# Patient Record
Sex: Female | Born: 1995 | Race: White | Hispanic: No | Marital: Single | Smoking: Current every day smoker
Health system: Southern US, Community
[De-identification: ages and names within clinical notes are randomized; demographics above are authoritative.]

## PROBLEM LIST (undated history)

## (undated) DIAGNOSIS — D649 Anemia, unspecified: Secondary | ICD-10-CM

## (undated) DIAGNOSIS — F419 Anxiety disorder, unspecified: Secondary | ICD-10-CM

## (undated) DIAGNOSIS — F329 Major depressive disorder, single episode, unspecified: Secondary | ICD-10-CM

## (undated) DIAGNOSIS — F32A Depression, unspecified: Secondary | ICD-10-CM

## (undated) DIAGNOSIS — B1920 Unspecified viral hepatitis C without hepatic coma: Secondary | ICD-10-CM

## (undated) HISTORY — DX: Depression, unspecified: F32.A

## (undated) HISTORY — DX: Anemia, unspecified: D64.9

## (undated) HISTORY — PX: OTHER SURGICAL HISTORY: SHX169

## (undated) HISTORY — PX: LAPAROSCOPIC CHOLECYSTECTOMY: SUR755

## (undated) HISTORY — DX: Major depressive disorder, single episode, unspecified: F32.9

## (undated) HISTORY — DX: Anxiety disorder, unspecified: F41.9

---
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
## 2016-08-21 NOTE — L&D Delivery Note (Signed)
Patient is a 21 y.o. now [id number] s/p NSVD at [redacted]w[redacted]d, who was admitted for SROM and onset of labor.  Delivery Note At 10:38 AM a viable female was delivered via Vaginal, Spontaneous Delivery (Presentation:OA).  APGAR: 9, 9; weight  pending.   Placenta status: intact.  Cord: 3-vessel with no complications.  Cord pH: pending  Anesthesia:  none Episiotomy: None Lacerations: Hemastatic periurethral bilateral tears. No repair indicated Suture Repair: none Est. Blood Loss (mL):  Mom to postpartum.  Baby to Couplet care / Skin to Skin.  Arlyce Harman 05/24/2017, 10:55 AM     Head delivered OA. No nuchal cord present. Shoulder and body delivered in usual fashion. Infant with spontaneous cry, placed on mother's abdomen, dried and bulb suctioned. Cord clamped x 2 after 1-minute delay, and cut by family member. Cord blood drawn. Placenta delivered spontaneously with gentle cord traction. Fundus firm with massage and Pitocin. Perineum inspected and found to have bilateral periuretheral lacerations, which was found to be hemostatic.  Jules Schick, DO PGY-1, [facility] 05/24/17, 10:55 AM

## 2017-03-27 ENCOUNTER — Other Ambulatory Visit (HOSPITAL_COMMUNITY)
Admission: RE | Admit: 2017-03-27 | Discharge: 2017-03-27 | Disposition: A | Discharge status: Home / Self Care | Payer: Medicaid Other | Source: Ambulatory Visit | Attending: Obstetrics and Gynecology | Admitting: Obstetrics and Gynecology

## 2017-03-27 ENCOUNTER — Encounter: Payer: Self-pay | Admitting: Obstetrics and Gynecology

## 2017-03-27 ENCOUNTER — Ambulatory Visit (INDEPENDENT_AMBULATORY_CARE_PROVIDER_SITE_OTHER): Payer: Medicaid Other | Admitting: Obstetrics and Gynecology

## 2017-03-27 DIAGNOSIS — O0930 Supervision of pregnancy with insufficient antenatal care, unspecified trimester: Secondary | ICD-10-CM | POA: Insufficient documentation

## 2017-03-27 DIAGNOSIS — Z3492 Encounter for supervision of normal pregnancy, unspecified, second trimester: Secondary | ICD-10-CM | POA: Insufficient documentation

## 2017-03-27 DIAGNOSIS — O0932 Supervision of pregnancy with insufficient antenatal care, second trimester: Secondary | ICD-10-CM | POA: Diagnosis not present

## 2017-03-27 HISTORY — DX: Supervision of pregnancy with insufficient antenatal care, unspecified trimester: O09.30

## 2017-03-27 HISTORY — DX: Encounter for supervision of normal pregnancy, unspecified, second trimester: Z34.92

## 2017-03-27 NOTE — Progress Notes (Signed)
New OB Note  03/27/2017   [facility]inic: [facility]  Chief Complaint: NOB  Transfer of Care Patient: no  History of Present Illness: Ms. Shackleford is a 21 y.o. G2P1001 @ 23/5 weeks ([facility] 11/29 [tentative], based on Patient's last menstrual period was 10/12/2016 (exact date).).  Preg complicated by has Encounter for supervision of normal pregnancy in second trimester and Late prenatal care affecting pregnancy on her problem list.   Any events prior to today's visit: no Her periods were: regular, qmonth She was using no method when she conceived.  She has Negative signs or symptoms of nausea/vomiting of pregnancy. She has Negative signs or symptoms of miscarriage or preterm labor On any different medications around the time she conceived/early pregnancy: No   ROS: A 12-point review of systems was performed and negative, except as stated in the above HPI.  OBGYN History: As per HPI. OB History  Gravida Para Term Preterm AB Living  2 1 1     1   SAB TAB Ectopic Multiple Live Births               # Outcome Date GA Lbr Len/2nd Weight Sex Delivery Anes PTL Lv  2 Current           1 Term             Obstetric Comments  G1: 08/2014, SVD 6lbs 5oz, no issues or problems.    Any issues with any prior pregnancies: no Prior children are healthy, doing well, and without any problems or issues: yes History of pap smears: Yes. Last pap smear unsure and results were negative   Past Medical History: Past Medical History:  Diagnosis Date  . Anemia   . Anxiety   . Depression     Past Surgical History: Past Surgical History:  Procedure Laterality Date  . bone graft Right    arm  . LAPAROSCOPIC CHOLECYSTECTOMY      Family History:  Family History  Problem Relation Age of Onset  . Depression Mother   . Miscarriages / [place] Mother   . Vision loss Mother   . Varicose Veins Mother   . Drug abuse Father   . Cancer Maternal Aunt   . Arthritis Maternal Grandmother   .  Cancer Maternal Grandmother   . Hypertension Maternal Grandmother   . Cancer Maternal Grandfather    She denies any history of mental retardation, birth defects or genetic disorders in her or the FOB's history  Social History:  Social History   Social History  . Marital status: Single    Spouse name: N/A  . Number of children: N/A  . Years of education: N/A   Occupational History  . Not on file.   Social History Main Topics  . Smoking status: Current Every Day Smoker    Types: Cigarettes  . Smokeless tobacco: Current User  . Alcohol use No  . Drug use: No  . Sexual activity: Yes   Other Topics Concern  . Not on file   Social History Narrative  . No narrative on file    Allergy: Allergies  Allergen Reactions  . Sulphadimidine [Sulfamethazine] Tinitus    Health Maintenance:  Mammogram Up to Date: not applicable  Current Outpatient Medications: PNV  Physical Exam:   BP 98/71   Pulse 96   Temp 97.8 F (36.6 C)   Ht 5' (1.524 m)   Wt 117 lb 9.6 oz (53.3 kg)   LMP 10/12/2016 (Exact Date)  BMI 22.97 kg/m  Body mass index is 22.97 kg/m. Contractions: Not present Vag. Bleeding: None. Fundal height: 24 FHTs: 130s  General appearance: Well nourished, well developed female in no acute distress.  Cardiovascular: S1, S2 normal, no murmur, rub or gallop, regular rate and rhythm Respiratory:  Clear to auscultation bilateral. Normal respiratory effort Abdomen: positive bowel sounds and no masses, hernias; diffusely non tender to palpation, non distended Breasts: pt denies any breast s/s. Neuro/Psych:  Normal mood and affect.  Skin:  Warm and dry.  Lymphatic:  No inguinal lymphadenopathy.   Pelvic exam: is not limited by body habitus EGBUS: within normal limits, Vagina: within normal limits and with no blood in the vault, Cervix: normal appearing cervix without discharge or lesions, visually closed Uterus:  enlarged, c/w 24 week size, and Adnexa:  normal adnexa  and no mass, fullness, tenderness  Laboratory: none  Imaging:  none  Assessment: pt doing well  Plan: 1. Encounter for supervision of normal pregnancy in second trimester, unspecified gravidity Pt states she had an u/s at [facility] [place] at around 9wks [facility] ED[facility]. Will see if [facility] has records and have pt sign release form. 2hr GTT nv. Tobacco cessation encouraged.  - Obstetric Panel, Including HIV - Hemoglobinopathy evaluation - Culture, OB Urine - Cytology - PAP - Varicella zoster antibody, IgG - Cystic fibrosis gene test - SMN1 Copy Number Analysis - ToxASSURE Select 13 (MW), Urine - US MFM OB COMP + 14 WK; Future  2. Late prenatal care affecting pregnancy in second trimester - ToxASSURE Select 2613 (MW), Urine  Problem list reviewed and updated.  Follow up in 3 weeks.  The nature of [facility] with multiple MDs and other Advanced Practice Providers was explained to patient; also emphasized that residents, students are part of our team.  >50% of 20 min visit spent on counseling and coordination of care.     Cornelia Copaharlie Analyse Angst, Jr. MD Attending [facility](Faculty Practice)

## 2017-03-27 NOTE — Progress Notes (Signed)
Late to prenatal Care @23  weeks

## 2017-03-28 LAB — CYTOLOGY - PAP
Chlamydia: NEGATIVE
Diagnosis: NEGATIVE
Neisseria Gonorrhea: NEGATIVE
Trichomonas: NEGATIVE

## 2017-03-29 LAB — HEMOGLOBINOPATHY EVALUATION
HGB C: 0 %
HGB S: 0 %
HGB VARIANT: 0 %
Hemoglobin A2 Quantitation: 2.6 % (ref 1.8–3.2)
Hemoglobin F Quantitation: 0 % (ref 0.0–2.0)
Hgb A: 97.4 % (ref 96.4–98.8)

## 2017-03-29 LAB — OBSTETRIC PANEL, INCLUDING HIV
Antibody Screen: NEGATIVE
Basophils Absolute: 0 10*3/uL (ref 0.0–0.2)
Basos: 0 %
EOS (ABSOLUTE): 0.2 10*3/uL (ref 0.0–0.4)
Eos: 2 %
HIV Screen 4th Generation wRfx: NONREACTIVE
Hematocrit: 34.6 % (ref 34.0–46.6)
Hemoglobin: 11.7 g/dL (ref 11.1–15.9)
Hepatitis B Surface Ag: NEGATIVE
Immature Grans (Abs): 0 10*3/uL (ref 0.0–0.1)
Immature Granulocytes: 0 %
Lymphocytes Absolute: 2.8 10*3/uL (ref 0.7–3.1)
Lymphs: 28 %
MCH: 31.7 pg (ref 26.6–33.0)
MCHC: 33.8 g/dL (ref 31.5–35.7)
MCV: 94 fL (ref 79–97)
Monocytes Absolute: 0.6 10*3/uL (ref 0.1–0.9)
Monocytes: 6 %
Neutrophils Absolute: 6.6 10*3/uL (ref 1.4–7.0)
Neutrophils: 64 %
Platelets: 219 10*3/uL (ref 150–379)
RBC: 3.69 x10E6/uL — ABNORMAL LOW (ref 3.77–5.28)
RDW: 13.1 % (ref 12.3–15.4)
RPR Ser Ql: NONREACTIVE
Rh Factor: POSITIVE
Rubella Antibodies, IGG: 14.2 index (ref >0.99)
WBC: 10.2 10*3/uL (ref 3.4–10.8)

## 2017-03-29 LAB — URINE CULTURE, OB REFLEX

## 2017-03-29 LAB — CULTURE, OB URINE

## 2017-03-29 LAB — VARICELLA ZOSTER ANTIBODY, IGG: Varicella zoster IgG: 1323 index (ref >165)

## 2017-03-30 LAB — TOXASSURE SELECT 13 (MW), URINE

## 2017-04-02 ENCOUNTER — Other Ambulatory Visit: Payer: Self-pay | Admitting: Obstetrics and Gynecology

## 2017-04-02 ENCOUNTER — Ambulatory Visit (HOSPITAL_COMMUNITY)
Admission: RE | Admit: 2017-04-02 | Discharge: 2017-04-02 | Disposition: A | Discharge status: Home / Self Care | Payer: Medicaid Other | Source: Ambulatory Visit | Attending: Obstetrics and Gynecology | Admitting: Obstetrics and Gynecology

## 2017-04-02 DIAGNOSIS — Z3A29 29 weeks gestation of pregnancy: Secondary | ICD-10-CM

## 2017-04-02 DIAGNOSIS — O99332 Smoking (tobacco) complicating pregnancy, second trimester: Secondary | ICD-10-CM | POA: Insufficient documentation

## 2017-04-02 DIAGNOSIS — O0932 Supervision of pregnancy with insufficient antenatal care, second trimester: Secondary | ICD-10-CM | POA: Diagnosis not present

## 2017-04-02 DIAGNOSIS — Z3689 Encounter for other specified antenatal screening: Secondary | ICD-10-CM

## 2017-04-02 DIAGNOSIS — Z3492 Encounter for supervision of normal pregnancy, unspecified, second trimester: Secondary | ICD-10-CM

## 2017-04-03 LAB — SMN1 COPY NUMBER ANALYSIS (SMA CARRIER SCREENING)

## 2017-04-03 LAB — CYSTIC FIBROSIS GENE TEST

## 2017-04-05 ENCOUNTER — Encounter: Payer: Self-pay | Admitting: Obstetrics and Gynecology

## 2017-04-17 ENCOUNTER — Encounter: Payer: Self-pay | Admitting: Obstetrics

## 2017-04-17 ENCOUNTER — Ambulatory Visit (INDEPENDENT_AMBULATORY_CARE_PROVIDER_SITE_OTHER): Payer: Medicaid Other | Admitting: Obstetrics

## 2017-04-17 ENCOUNTER — Other Ambulatory Visit: Payer: Medicaid Other

## 2017-04-17 VITALS — BP 111/79 | HR 109 | Wt 117.0 lb

## 2017-04-17 DIAGNOSIS — O9989 Other specified diseases and conditions complicating pregnancy, childbirth and the puerperium: Secondary | ICD-10-CM

## 2017-04-17 DIAGNOSIS — Z3482 Encounter for supervision of other normal pregnancy, second trimester: Secondary | ICD-10-CM

## 2017-04-17 DIAGNOSIS — Z3480 Encounter for supervision of other normal pregnancy, unspecified trimester: Secondary | ICD-10-CM

## 2017-04-17 DIAGNOSIS — M549 Dorsalgia, unspecified: Secondary | ICD-10-CM

## 2017-04-17 NOTE — Progress Notes (Signed)
Patient is having some back pain- patient reports she is sleeping more than normal.

## 2017-04-17 NOTE — Progress Notes (Signed)
Subjective:  Tamara Harrell is a 21 y.o. G2P1001 at [redacted]w[redacted]d being seen today for ongoing prenatal care.  She is currently monitored for the following issues for this low-risk pregnancy and has Encounter for supervision of normal pregnancy in second trimester and Late prenatal care affecting pregnancy on her problem list.  Patient reports backache.  Contractions: Not present. Vag. Bleeding: None.  Movement: Present. Denies leaking of fluid.   The following portions of the patient's history were reviewed and updated as appropriate: allergies, current medications, past family history, past medical history, past social history, past surgical history and problem list. Problem list updated.  Objective:   Vitals:   04/17/17 0843  BP: 111/79  Pulse: (!) 109  Weight: 117 lb (53.1 kg)    Fetal Status: Fetal Heart Rate (bpm): 140   Movement: Present     General:  Alert, oriented and cooperative. Patient is in no acute distress.  Skin: Skin is warm and dry. No rash noted.   Cardiovascular: Normal heart rate noted  Respiratory: Normal respiratory effort, no problems with respiration noted  Abdomen: Soft, gravid, appropriate for gestational age. Pain/Pressure: Absent     Pelvic:  Cervical exam deferred        Extremities: Normal range of motion.  Edema: None  Mental Status: Normal mood and affect. Normal behavior. Normal judgment and thought content.   Urinalysis:      Assessment and Plan:  Pregnancy: G2P1001 at [redacted]w[redacted]d  1. Supervision of other normal pregnancy, antepartum   2. Backache symptom - needs Maternity Belt  There are no diagnoses linked to this encounter. Preterm labor symptoms and general obstetric precautions including but not limited to vaginal bleeding, contractions, leaking of fluid and fetal movement were reviewed in detail with the patient. Please refer to After Visit Summary for other counseling recommendations.  Return in about 2 weeks (around 05/01/2017) for  ROB.   Brock Bad, MD

## 2017-04-18 LAB — CBC
HEMOGLOBIN: 12.1 g/dL (ref 11.1–15.9)
Hematocrit: 35.5 % (ref 34.0–46.6)
MCH: 32.1 pg (ref 26.6–33.0)
MCHC: 34.1 g/dL (ref 31.5–35.7)
MCV: 94 fL (ref 79–97)
Platelets: 221 10*3/uL (ref 150–379)
RBC: 3.77 x10E6/uL (ref 3.77–5.28)
RDW: 13 % (ref 12.3–15.4)
WBC: 12.3 10*3/uL — AB (ref 3.4–10.8)

## 2017-04-18 LAB — GLUCOSE TOLERANCE, 2 HOURS W/ 1HR
GLUCOSE, 1 HOUR: 99 mg/dL (ref 65–179)
GLUCOSE, 2 HOUR: 133 mg/dL (ref 65–152)
Glucose, Fasting: 79 mg/dL (ref 65–91)

## 2017-04-18 LAB — RPR: RPR: NONREACTIVE

## 2017-04-18 LAB — HIV ANTIBODY (ROUTINE TESTING W REFLEX): HIV SCREEN 4TH GENERATION: NONREACTIVE

## 2017-04-24 ENCOUNTER — Encounter: Payer: Medicaid Other | Admitting: Obstetrics

## 2017-05-01 ENCOUNTER — Encounter: Payer: Medicaid Other | Admitting: Obstetrics and Gynecology

## 2017-05-14 ENCOUNTER — Telehealth: Payer: Self-pay

## 2017-05-14 NOTE — Telephone Encounter (Signed)
Called pt Left message to call office to schedule OBF appt.. Pt no showed for appt on 9/11. Last seen aug. 28

## 2017-05-24 ENCOUNTER — Encounter (HOSPITAL_COMMUNITY): Payer: Self-pay | Admitting: *Deleted

## 2017-05-24 ENCOUNTER — Inpatient Hospital Stay (HOSPITAL_COMMUNITY)
Admission: AD | Admit: 2017-05-24 | Discharge: 2017-05-26 | DRG: 807 | Disposition: A | Discharge status: Home / Self Care | Payer: Medicaid Other | Source: Ambulatory Visit | Attending: Obstetrics and Gynecology | Admitting: Obstetrics and Gynecology

## 2017-05-24 DIAGNOSIS — O99334 Smoking (tobacco) complicating childbirth: Secondary | ICD-10-CM | POA: Diagnosis present

## 2017-05-24 DIAGNOSIS — Z3A36 36 weeks gestation of pregnancy: Secondary | ICD-10-CM

## 2017-05-24 DIAGNOSIS — Z3492 Encounter for supervision of normal pregnancy, unspecified, second trimester: Secondary | ICD-10-CM

## 2017-05-24 DIAGNOSIS — F17210 Nicotine dependence, cigarettes, uncomplicated: Secondary | ICD-10-CM | POA: Diagnosis present

## 2017-05-24 DIAGNOSIS — O0930 Supervision of pregnancy with insufficient antenatal care, unspecified trimester: Secondary | ICD-10-CM

## 2017-05-24 DIAGNOSIS — O0932 Supervision of pregnancy with insufficient antenatal care, second trimester: Secondary | ICD-10-CM

## 2017-05-24 HISTORY — DX: Supervision of pregnancy with insufficient antenatal care, unspecified trimester: O09.30

## 2017-05-24 LAB — CBC
HEMATOCRIT: 34.3 % — AB (ref 36.0–46.0)
Hemoglobin: 11.9 g/dL — ABNORMAL LOW (ref 12.0–15.0)
MCH: 31.7 pg (ref 26.0–34.0)
MCHC: 34.7 g/dL (ref 30.0–36.0)
MCV: 91.5 fL (ref 78.0–100.0)
PLATELETS: 213 10*3/uL (ref 150–400)
RBC: 3.75 MIL/uL — ABNORMAL LOW (ref 3.87–5.11)
RDW: 13 % (ref 11.5–15.5)
WBC: 19.2 10*3/uL — ABNORMAL HIGH (ref 4.0–10.5)

## 2017-05-24 LAB — RAPID URINE DRUG SCREEN, HOSP PERFORMED
AMPHETAMINES: NOT DETECTED
BENZODIAZEPINES: NOT DETECTED
Barbiturates: NOT DETECTED
Cocaine: NOT DETECTED
OPIATES: NOT DETECTED
TETRAHYDROCANNABINOL: NOT DETECTED

## 2017-05-24 LAB — TYPE AND SCREEN
ABO/RH(D): O POS
Antibody Screen: NEGATIVE

## 2017-05-24 LAB — ABO/RH: ABO/RH(D): O POS

## 2017-05-24 MED ORDER — LACTATED RINGERS IV SOLN
INTRAVENOUS | Status: DC
  Administered 2017-05-24: 10:00:00 via INTRAVENOUS

## 2017-05-24 MED ORDER — FLEET ENEMA 7-19 GM/118ML RE ENEM: 1.0000 | ENEMA | Freq: Every day | RECTAL | Status: DC | PRN

## 2017-05-24 MED ORDER — PENICILLIN G POT IN DEXTROSE 60000 UNIT/ML IV SOLN
3.0000 10*6.[IU] | INTRAVENOUS | Status: DC
  Filled 2017-05-24: qty 50

## 2017-05-24 MED ORDER — LIDOCAINE HCL (PF) 1 % IJ SOLN
30.0000 mL | INTRAMUSCULAR | Status: DC | PRN
  Filled 2017-05-24: qty 30

## 2017-05-24 MED ORDER — PHENYLEPHRINE 40 MCG/ML (10ML) SYRINGE FOR IV PUSH (FOR BLOOD PRESSURE SUPPORT)
80.0000 ug | PREFILLED_SYRINGE | INTRAVENOUS | Status: DC | PRN
  Filled 2017-05-24: qty 10
  Filled 2017-05-24: qty 5

## 2017-05-24 MED ORDER — BETAMETHASONE SOD PHOS & ACET 6 (3-3) MG/ML IJ SUSP
12.0000 mg | Freq: Once | INTRAMUSCULAR | Status: AC
  Administered 2017-05-24: 12 mg via INTRAMUSCULAR
  Filled 2017-05-24: qty 2

## 2017-05-24 MED ORDER — SODIUM CHLORIDE 0.9 % IV SOLN
2.0000 g | Freq: Once | INTRAVENOUS | Status: AC
  Administered 2017-05-24: 2 g via INTRAVENOUS
  Filled 2017-05-24: qty 2000

## 2017-05-24 MED ORDER — ZOLPIDEM TARTRATE 5 MG PO TABS: 5.0000 mg | ORAL_TABLET | Freq: Every evening | ORAL | Status: DC | PRN

## 2017-05-24 MED ORDER — ACETAMINOPHEN 325 MG PO TABS
650.0000 mg | ORAL_TABLET | ORAL | Status: DC | PRN
  Administered 2017-05-24 – 2017-05-25 (×2): 650 mg via ORAL
  Filled 2017-05-24 (×2): qty 2

## 2017-05-24 MED ORDER — ACETAMINOPHEN 325 MG PO TABS: 650.0000 mg | ORAL_TABLET | ORAL | Status: DC | PRN

## 2017-05-24 MED ORDER — ONDANSETRON HCL 4 MG PO TABS: 4.0000 mg | ORAL_TABLET | ORAL | Status: DC | PRN

## 2017-05-24 MED ORDER — FENTANYL CITRATE (PF) 100 MCG/2ML IJ SOLN
50.0000 ug | INTRAMUSCULAR | Status: DC | PRN
  Administered 2017-05-24: 100 ug via INTRAVENOUS
  Filled 2017-05-24: qty 2

## 2017-05-24 MED ORDER — FENTANYL 2.5 MCG/ML BUPIVACAINE 1/10 % EPIDURAL INFUSION (WH - ANES)
14.0000 mL/h | INTRAMUSCULAR | Status: DC | PRN
  Filled 2017-05-24 (×2): qty 100

## 2017-05-24 MED ORDER — OXYTOCIN BOLUS FROM INFUSION
500.0000 mL | Freq: Once | INTRAVENOUS | Status: AC
  Administered 2017-05-24: 500 mL via INTRAVENOUS

## 2017-05-24 MED ORDER — WITCH HAZEL-GLYCERIN EX PADS: 1.0000 "application " | MEDICATED_PAD | CUTANEOUS | Status: DC | PRN

## 2017-05-24 MED ORDER — SOD CITRATE-CITRIC ACID 500-334 MG/5ML PO SOLN: 30.0000 mL | ORAL | Status: DC | PRN

## 2017-05-24 MED ORDER — SENNOSIDES-DOCUSATE SODIUM 8.6-50 MG PO TABS
2.0000 | ORAL_TABLET | ORAL | Status: DC
  Administered 2017-05-25 (×2): 2 via ORAL
  Filled 2017-05-24 (×2): qty 2

## 2017-05-24 MED ORDER — IBUPROFEN 600 MG PO TABS
600.0000 mg | ORAL_TABLET | Freq: Four times a day (QID) | ORAL | Status: DC
  Administered 2017-05-24 – 2017-05-26 (×9): 600 mg via ORAL
  Filled 2017-05-24 (×9): qty 1

## 2017-05-24 MED ORDER — LACTATED RINGERS IV SOLN: 500.0000 mL | Freq: Once | INTRAVENOUS | Status: DC

## 2017-05-24 MED ORDER — SIMETHICONE 80 MG PO CHEW
80.0000 mg | CHEWABLE_TABLET | ORAL | Status: DC | PRN
  Filled 2017-05-24: qty 1

## 2017-05-24 MED ORDER — OXYTOCIN 40 UNITS IN LACTATED RINGERS INFUSION - SIMPLE MED
2.5000 [IU]/h | INTRAVENOUS | Status: DC
  Filled 2017-05-24: qty 1000

## 2017-05-24 MED ORDER — DIPHENHYDRAMINE HCL 50 MG/ML IJ SOLN: 12.5000 mg | INTRAMUSCULAR | Status: DC | PRN

## 2017-05-24 MED ORDER — LACTATED RINGERS IV SOLN: 500.0000 mL | INTRAVENOUS | Status: DC | PRN

## 2017-05-24 MED ORDER — EPHEDRINE 5 MG/ML INJ
10.0000 mg | INTRAVENOUS | Status: DC | PRN
  Filled 2017-05-24: qty 2

## 2017-05-24 MED ORDER — PRENATAL MULTIVITAMIN CH
1.0000 | ORAL_TABLET | Freq: Every day | ORAL | Status: DC
  Administered 2017-05-25 – 2017-05-26 (×2): 1 via ORAL
  Filled 2017-05-24 (×2): qty 1

## 2017-05-24 MED ORDER — PENICILLIN G POTASSIUM 5000000 UNITS IJ SOLR
5.0000 10*6.[IU] | Freq: Once | INTRAVENOUS | Status: DC
  Filled 2017-05-24: qty 5

## 2017-05-24 MED ORDER — OXYCODONE-ACETAMINOPHEN 5-325 MG PO TABS
1.0000 | ORAL_TABLET | ORAL | Status: DC | PRN
  Administered 2017-05-24: 1 via ORAL
  Filled 2017-05-24: qty 1

## 2017-05-24 MED ORDER — PHENYLEPHRINE 40 MCG/ML (10ML) SYRINGE FOR IV PUSH (FOR BLOOD PRESSURE SUPPORT)
80.0000 ug | PREFILLED_SYRINGE | INTRAVENOUS | Status: DC | PRN
  Filled 2017-05-24: qty 5

## 2017-05-24 MED ORDER — DIPHENHYDRAMINE HCL 25 MG PO CAPS: 25.0000 mg | ORAL_CAPSULE | Freq: Four times a day (QID) | ORAL | Status: DC | PRN

## 2017-05-24 MED ORDER — ONDANSETRON HCL 4 MG/2ML IJ SOLN: 4.0000 mg | INTRAMUSCULAR | Status: DC | PRN

## 2017-05-24 MED ORDER — ONDANSETRON HCL 4 MG/2ML IJ SOLN: 4.0000 mg | Freq: Four times a day (QID) | INTRAMUSCULAR | Status: DC | PRN

## 2017-05-24 MED ORDER — TETANUS-DIPHTH-ACELL PERTUSSIS 5-2.5-18.5 LF-MCG/0.5 IM SUSP
0.5000 mL | Freq: Once | INTRAMUSCULAR | Status: AC
  Administered 2017-05-25: 0.5 mL via INTRAMUSCULAR
  Filled 2017-05-24: qty 0.5

## 2017-05-24 MED ORDER — OXYCODONE-ACETAMINOPHEN 5-325 MG PO TABS: 2.0000 | ORAL_TABLET | ORAL | Status: DC | PRN

## 2017-05-24 MED ORDER — DIBUCAINE 1 % RE OINT: 1.0000 "application " | TOPICAL_OINTMENT | RECTAL | Status: DC | PRN

## 2017-05-24 MED ORDER — BENZOCAINE-MENTHOL 20-0.5 % EX AERO
1.0000 "application " | INHALATION_SPRAY | CUTANEOUS | Status: DC | PRN
  Administered 2017-05-24: 1 via TOPICAL
  Filled 2017-05-24: qty 56

## 2017-05-24 MED ORDER — COCONUT OIL OIL
1.0000 "application " | TOPICAL_OIL | Status: DC | PRN
  Administered 2017-05-25: 1 via TOPICAL
  Filled 2017-05-24: qty 120

## 2017-05-24 NOTE — H&P (Signed)
LABOR AND DELIVERY ADMISSION HISTORY AND PHYSICAL NOTE  Tamara Harrell is a 21 y.o. female G2P1001 with IUP at [redacted]w[redacted]d by U/S presenting for spontaneous onset of preterm labor. She came in due to "her water breaking" and increased frequency and severity of contractions that are painful. She reports positive fetal movement. She reports leakage of fluid with vaginal spotting for the last couple of hours.  Prenatal History/Complications: PNC at [facility] Pregnancy complications:  - None  Past Medical History: Past Medical History:  Diagnosis Date  . Anemia   . Anxiety   . Depression     Past Surgical History: Past Surgical History:  Procedure Laterality Date  . bone graft Right    arm  . LAPAROSCOPIC CHOLECYSTECTOMY      Obstetrical History: OB History    Gravida Para Term Preterm AB Living   SAB TAB Ectopic Multiple Live Births                  Obstetric Comments   G1: 08/2014, SVD 6lbs 5oz, no issues or problems.       Social History: Social History   Social History  . Marital status: Single    Spouse name: N/A  . Number of children: N/A  . Years of education: N/A   Social History Main Topics  . Smoking status: Current Every Day Smoker    Packs/day: 0.25    Types: Cigarettes  . Smokeless tobacco: Current User  . Alcohol use No  . Drug use: No  . Sexual activity: Yes   Other Topics Concern  . None   Social History Narrative  . None    Family History: Family History  Problem Relation Age of Onset  . Depression Mother   . Miscarriages / [place] Mother   . Vision loss Mother   . Varicose Veins Mother   . Drug abuse Father   . Cancer Maternal Aunt   . Arthritis Maternal Grandmother   . Cancer Maternal Grandmother   . Hypertension Maternal Grandmother   . Cancer Maternal Grandfather     Allergies: Allergies  Allergen Reactions  . Sulphadimidine [Sulfamethazine] Other (See Comments)    Flu like symptoms    Prescriptions  Prior to Admission  Medication Sig Dispense Refill Last Dose  . acetaminophen (TYLENOL) 325 MG tablet Take 650 mg by mouth every 6 (six) hours as needed. For pain   05/22/2017 at Unknown time  . Prenatal Vit-Fe Fumarate-FA (MULTIVITAMIN-PRENATAL) 27-0.8 MG TABS tablet Take 1 tablet by mouth daily at 12 noon.   05/23/2017 at Unknown time     Review of Systems  All systems reviewed and negative except as stated in HPI  Physical Exam Blood pressure 124/86, pulse 98, temperature 97.7 F (36.5 C), resp. rate 17, last menstrual period 10/12/2016, SpO2 98 %. General appearance: alert, cooperative and mild distress Lungs: clear to auscultation bilaterally Heart: regular rate and rhythm Abdomen: soft, non-tender; bowel sounds normal, no RUQ pain Extremities: No calf swelling or tenderness Presentation: vertex Fetal monitoring: 145bpm, moderate variability, accelerations present, decelerations absent Uterine activity: contractions regular every 3-5 Dilation: 4.5 Effacement (%): 60 Station: -2 Exam by:: Janeth Rase RNC  Prenatal labs: ABO, Rh: O/Positive/-- (08/07 1016) Antibody: Negative (08/07 1016) Rubella: 14.20 (08/07 1016) RPR: Non Reactive (08/28 1035)  HBsAg: Negative (08/07 1016)  HIV:   neg GC/Chlamydia: negative on 03/27/2017 GBS:   not on file 2 hr Glucola: 79, 99, 133  Genetic screening:   Anatomy US: neg for any abnormality 36th percentile  Prenatal Transfer Tool  Maternal Diabetes: No Genetic Screening: Normal Maternal Ultrasounds/Referrals: Normal Fetal Ultrasounds or other Referrals:  None Maternal Substance Abuse:  Yes:  Type: Smoker Significant Maternal Medications:  None Significant Maternal Lab Results: None  No results found for this or any previous visit (from the past 24 hour(s)).  Patient Active Problem List   Diagnosis Date Noted  . Insufficient prenatal care 05/24/2017  . Encounter for supervision of normal pregnancy in second trimester  03/27/2017  . Late prenatal care affecting pregnancy 03/27/2017    Assessment: Tamara Harrell is a 21 y.o. G2P1001 at [redacted]w[redacted]d here for Spontaneous onset of labor.   #Labor: progressing naturally, continue to monitor #Pain: Start pain meds, epi #FWB: Cat 1 #ID:  Amp x 2 #MOF: breast #MOC: undecided  #Circ:  undecided  Arlyce Harman 05/24/2017, 9:14 AM

## 2017-05-24 NOTE — Progress Notes (Signed)
Patient ID: Tamara Harrell, female   DOB: 22-Nov-1995, 21 y.o.   MRN: [id number]   [facility]SP for [facility] for complete dilation without epidural and strong need to push. With one contraction the patient delivered the fetal head to crowning over an intact perineum. THe infant dried vigorously and was passed to the maternal abdomen. Faculty [facility] attending arrived at this point and took over care. Please refer to their delivery summary

## 2017-05-24 NOTE — Lactation Note (Signed)
This note was copied from a baby's chart. Lactation Consultation Note  Patient Name: Tamara Harrell [id number] Date: 05/24/2017 Reason for consult: Initial assessment;Late-preterm 34-36.6wks  LPI 13 hours old. Baby has low temp, so taken to warmer in nursery. Mom reports that baby nursed well initially, but then has been sleepy at the breast. Discussed LPI behavior, and initiated DEBP with mom. Mom reports that she is comfortable with hand expression. Reviewed LPI guidelines and enc mom to follow guidelines for supplementing and to keep total feeding time to 30 minutes. Mom agreeable to supplementing with Alimentum as needed. Mom given curve-tipped syringe with review and knows to call for assistance as needed with use.   Mom gave permission to send BF referral to [facility] and it was faxed to [facility] office. Discussed assessment and interventions with Misty Stanley, RN and Milly Jakob, RN.   Maternal Data Has patient been taught Hand Expression?: Yes Does the patient have breastfeeding experience prior to this delivery?: Yes  Feeding    LATCH Score                   Interventions    Lactation Tools Discussed/Used Pump Review: Setup, frequency, and cleaning;Milk Storage Initiated by:: JW Date initiated:: 05/24/17   Consult Status Consult Status: Follow-up Date: 05/25/17 Follow-up type: In-patient    Sherlyn Hay 05/24/2017, 11:41 PM

## 2017-05-24 NOTE — MAU Note (Signed)
Pt reports contractions every 4-5 mins. Pt denies LOF or vaginal bleeding. Reports good fetal movement.

## 2017-05-24 NOTE — Progress Notes (Signed)
Bedside ultrasound showed cephalic presentation. Will give betamethasone dose, and Fentanyl for pain for now Start start PCN for GBS prophylaxis  Jaynie Collins, MD, FACOG Attending Obstetrician & Gynecologist, [facility] for [facility], [facility]

## 2017-05-25 LAB — RPR: RPR: NONREACTIVE

## 2017-05-25 NOTE — Lactation Note (Signed)
This note was copied from a baby's chart. Lactation Consultation Note  Patient Name: Tamara Harrell [id number] Date: 05/25/2017 Reason for consult: Follow-up assessment Baby at 35 hr of life. Upon entry baby was coming off the L breast. Mom reports baby is latching well. She has "some nipple soreness" that is relieved by coconut oil. She has been using the DEBP. Suggested she move up to the #27 flanges. Mom reports bf is this baby has been "so much better than the last one, he is so laid back and latches with no fuss". Discussed LPT baby behavior, pacifier use, feeding frequency, pumping, supplementing volume guidelines, baby belly size, voids, wt loss, breast changes, and nipple care. Parents are aware of lactation services and support group.  Mom will offer the breast on demand q3hr, post express, and offer expressed or formula per volume guidelines.     Maternal Data    Feeding Feeding Type: Breast Fed Length of feed: 20 min  LATCH Score                   Interventions    Lactation Tools Discussed/Used WIC Program: Yes   Consult Status Consult Status: Follow-up Date: 05/26/17 Follow-up type: In-patient    Rulon Eisenmenger 05/25/2017, 10:11 PM

## 2017-05-25 NOTE — Progress Notes (Signed)
Post Partum Day 1 Subjective: no complaints, up ad lib, voiding, tolerating PO and + flatus  Objective: Blood pressure 108/70, pulse 70, temperature 98.2 F (36.8 C), temperature source Oral, resp. rate 18, height 5' (1.524 m), weight 125 lb (56.7 kg), last menstrual period 10/12/2016, SpO2 99 %, unknown if currently breastfeeding.  Physical Exam:  General: alert, cooperative and no distress Lochia: appropriate Uterine Fundus: firm Incision: na DVT Evaluation: No evidence of DVT seen on physical exam.   Recent Labs  05/24/17 0930  HGB 11.9*  HCT 34.3*    Assessment/Plan: Plan for discharge tomorrow, Breastfeeding and Contraception possible Nexplanon   LOS: 1 day   Arlyce Harman 05/25/2017, 9:24 AM

## 2017-05-25 NOTE — Progress Notes (Signed)
CSW acknowledges consult and completed clinical assessment.  Clinical documentation will follow.  There are no barriers to d/c.  Christeen Lai Boyd-Gilyard, MSW, LCSW Clinical Social Work [phone]   

## 2017-05-26 ENCOUNTER — Ambulatory Visit: Payer: Self-pay

## 2017-05-26 ENCOUNTER — Encounter (HOSPITAL_COMMUNITY): Payer: Self-pay | Admitting: Advanced Practice Midwife

## 2017-05-26 MED ORDER — IBUPROFEN 600 MG PO TABS: 600.0000 mg | ORAL_TABLET | Freq: Four times a day (QID) | ORAL | 0 refills | Status: DC | PRN

## 2017-05-26 NOTE — Discharge Summary (Signed)
OB Discharge Summary     Patient Name: Tamara Harrell DOB: Dec 26, 1995 MRN: [id number]  Date of admission: 05/24/2017 Delivering MD: Waynard Reeds   Date of discharge: 05/26/2017  Admitting diagnosis: 40 WEEKS CTX Intrauterine pregnancy: [redacted]w[redacted]d     Secondary diagnosis:  Active Problems:   Insufficient prenatal care  Additional problems: GBS unknown     Discharge diagnosis: Preterm Pregnancy Delivered                                                                                                Post partum procedures:none  Augmentation: none  Complications: None  Hospital course:  Onset of Labor With Vaginal Delivery     21 y.o. yo [id number] at [redacted]w[redacted]d was admitted in Active Labor on 05/24/2017. Patient had an uncomplicated labor course as follows:  Membrane Rupture Time/Date: 10:35 AM ,05/24/2017   Intrapartum Procedures: Episiotomy: None [1]                                         Lacerations:  None [1]  Patient had a delivery of a Viable infant. 05/24/2017  Information for the patient's newborn:  Dawt, Reeb [[id number]]  Delivery Method: Vaginal, Spontaneous Delivery (Filed from Delivery Summary)    Pateint had an uncomplicated postpartum course.  She is ambulating, tolerating a regular diet, passing flatus, and urinating well. Patient is discharged home in stable condition on 05/26/17. Social work had inpt consultation due to late to care- no barriers to discharge.   Physical exam  Vitals:   05/25/17 0621 05/25/17 1000 05/25/17 1759 05/26/17 0552  BP: 108/70 110/72 118/84 (!) 138/92  Pulse: 70 76 74 63  Resp: Temp: 98.2 F (36.8 C) 98.7 F (37.1 C) 98.2 F (36.8 C) 97.9 F (36.6 C)  TempSrc: Oral Oral Oral Oral  SpO2:    99%  Weight:      Height:       General: alert and cooperative Lochia: appropriate Uterine Fundus: firm DVT Evaluation: No evidence of DVT seen on physical exam. Labs: Lab Results  Component Value Date   WBC 19.2 (H)  05/24/2017   HGB 11.9 (L) 05/24/2017   HCT 34.3 (L) 05/24/2017   MCV 91.5 05/24/2017   PLT 213 05/24/2017   CMP 04/01/2007  Glucose 106(H)  BUN 7  Creatinine 0.71  Sodium 134(L)  Potassium 4.2  Chloride 104  CO2 27  Calcium 8.8    Discharge instruction: per After Visit Summary and "Baby and Me Booklet".  After visit meds:  Allergies as of 05/26/2017      Reactions   Sulphadimidine [sulfamethazine] Other (See Comments)   Flu like symptoms      Medication List    STOP taking these medications   acetaminophen 325 MG tablet Commonly known as:  TYLENOL     TAKE these medications   ibuprofen 600 MG tablet Commonly known as:  ADVIL,MOTRIN Take 1 tablet (600 mg total) by mouth every 6 (six)  hours as needed.   multivitamin-prenatal 27-0.8 MG Tabs tablet Take 1 tablet by mouth daily at 12 noon.       Diet: routine diet  Activity: Advance as tolerated. Pelvic rest for 6 weeks.   Outpatient follow up:4 weeks Follow up Appt:Future Appointments Date Time Provider Department Center  06/21/2017 9:00 AM Brock Bad, MD [facility] None   Follow up Visit:No Follow-up on file.  Postpartum contraception: Nexplanon  Newborn Data: Live born female  Birth Weight: 5 lb 9.2 oz (2530 g) APGAR: 9, 9  Newborn Delivery   Birth date/time:  05/24/2017 10:38:00 Delivery type:  Vaginal, Spontaneous Delivery      Baby Feeding: Breast Disposition:home with mother   05/26/2017 Cam Hai, CNM  7:24 AM

## 2017-05-26 NOTE — Discharge Instructions (Signed)

## 2017-05-26 NOTE — Lactation Note (Signed)
This note was copied from a baby's chart. Lactation Consultation Note  Patient Name: Boy Alenna Russell ZOXWR'[id number] Date: 05/26/2017 Reason for consult: Follow-up assessment;Late-preterm 34-36.6wks;Infant < 6lbs Infant is 80 hours old and seen by Lactation for follow-up assessment. Baby was asleep with mom when LC entered. Mom reports feeding is going better and that her milk volume is increasing. Mom reports she has been pumping after BF for 15-20 mins and pumped ~32mL the last time. Mom reports that baby will latch but is sleepy and so she has to continually wake him while BF. Mom plans to rent a pump upon discharge. Mom expressed confidence that BF will continue to improve as baby gets older.  Encouraged mom to continue following LPI guidelines with increasing supplement after BF as baby gets older. Encouraged mom to BF at least 8-12x in 24hrs followed by pumping for 15-20 mins and hand expressing.  Mom was given 27mm flanges last night but mom reports she has not attempted using them but reports that she is having some nipple soreness with pumping as well as BF. Encouraged mom to try the 27mm flanges with next pumping session & to let us know if she needed a larger size than those. Mom reports using coconut oil after BF/ pumping helps her feel better. Discussed how she could also use expressed breastmilk. Mom reports her breasts are feeling more full and that BF/ pumping helps soften them. Discussed engorgement prevention & care. Mom reports no questions. Encouraged mom to ask for help as needed.  Maternal Data    Feeding Feeding Type: Breast Fed Length of feed: 25 min  LATCH Score Latch: Repeated attempts needed to sustain latch, nipple held in mouth throughout feeding, stimulation needed to elicit sucking reflex.  Audible Swallowing: Spontaneous and intermittent  Type of Nipple: Everted at rest and after stimulation  Comfort (Breast/Nipple): Soft / non-tender  Hold (Positioning):  Assistance needed to correctly position infant at breast and maintain latch.  LATCH Score: 8  Interventions    Lactation Tools Discussed/Used     Consult Status Consult Status: Follow-up Date: 05/27/17 Follow-up type: In-patient    Oneal Grout 05/26/2017, 7:04 PM

## 2017-05-27 ENCOUNTER — Ambulatory Visit: Payer: Self-pay

## 2017-05-27 NOTE — Lactation Note (Signed)
This note was copied from a baby's chart. Lactation Consultation Note  Patient Name: Tamara Harrell [id number]'[id number] Date: 05/27/2017 Reason for consult: Follow-up assessment  Baby 72 hours old. Mom called out for assistance with latch. Mom had baby latched to the tip of her nipple, so removed baby's blanket and enc nursing STS. Then, assisted mom with repositioning baby in cross-cradle position and baby latched more deeply. Demonstrated how to flange baby's lower lip--and mom reported increased comfort. Mom's breast are filling, so discussed engorgement prevention/treatment. Mom has manual pump, so enc mom to massage and post-pump as needed. Mom has EBM at bedside, so enc offering EBM to baby after baby nurses to make sure baby getting enough--while breasts engorged. Mom aware of OP/BFSG and LC phone line assistance after D/C.    Maternal Data    Feeding Feeding Type: Breast Fed  LATCH Score Latch: Grasps breast easily, tongue down, lips flanged, rhythmical sucking.  Audible Swallowing: Spontaneous and intermittent  Type of Nipple: Everted at rest and after stimulation  Comfort (Breast/Nipple): Filling, red/small blisters or bruises, mild/mod discomfort  Hold (Positioning): Assistance needed to correctly position infant at breast and maintain latch.  LATCH Score: 8  Interventions Interventions: Assisted with latch;Skin to skin;Pre-pump if needed;Breast compression;Adjust position;Support pillows;Position options;Expressed milk  Lactation Tools Discussed/Used     Consult Status Consult Status: PRN    Sherlyn Hay 05/27/2017, 11:29 AM

## 2017-05-27 NOTE — Lactation Note (Signed)
This note was copied from a baby's chart. Lactation Consultation Note  Patient Name: Tamara Harrell [id number] Date: 05/27/2017 Reason for consult: Follow-up assessment   Baby 20 hours old.  < 6 lbs.  [redacted]w[redacted]d. Mother states baby is breastfeeding well.  She is pumping 60 ml + per session with DEBP. Attempted breastfeeding but baby was fed at 0600 and appeared to be not hungry at this time. Suggest she call lactation to observe next feeding. Provided mother with manual pump.  Reminded mother to keep hat on infant to maintain temperature. Reviewed engorgement care and monitoring voids/stools and volume guidelines.  Plan: Mother will go to hospital store to rent DEBP for discharge.  She has an appointment with WIC later this week. Mother plans to breastfeed on demand at least q 3 hours and give supplemental breastmilk after. Post pump 4-6 times per day for 10-20 min.  Use volume for next feeding.m  Increase per day of life. Mom encouraged to feed baby 8-12 times/24 hours and with feeding cues at least q 3 hours.     Maternal Data    Feeding Feeding Type: Breast Fed Length of feed: 15 min  LATCH Score                   Interventions    Lactation Tools Discussed/Used     Consult Status Consult Status: Complete    [name] 05/27/2017, 8:12 AM

## 2017-05-28 ENCOUNTER — Ambulatory Visit: Payer: Self-pay

## 2017-05-28 NOTE — Clinical Social Work Maternal (Signed)
CLINICAL SOCIAL WORK MATERNAL/CHILD NOTE  Patient Details  Name: Tamara Harrell MRN: [id number] Date of Birth: [birth year]  Date:  05/28/2017  Clinical Social Worker Initiating Note:  Laurey Arrow Date/Time: Initiated:  05/25/17/1235     Child's Name:  Tamara Harrell   Biological Parents:  Mother, Father   Need for Interpreter:  None   Reason for Referral:  Behavioral Health Concerns, Current Substance Use/Substance Use During Pregnancy    Address:  [address] [address]    Phone number:  [phone] (home)     Additional phone number:   Household Members/Support Persons (HM/SP):   Household Member/Support Person 1   HM/SP Name Relationship DOB or Age  HM/SP -[place] Mother unknown  HM/SP -2        HM/SP -3        HM/SP -4        HM/SP -5        HM/SP -6        HM/SP -7        HM/SP -8          Natural Supports (not living in the home):  Immediate Family, Friends, Extended Family   Professional Supports: None   Employment: Unemployed   Type of Work:     Education:      Homebound arranged:    Museum/gallery curator Resources:  Medicaid   Other Resources:      Cultural/Religious Considerations Which May Impact Care:  Per [employer] Presenter, broadcasting, MOB is Tamara Harrell.   Strengths:  Home prepared for child , Compliance with medical plan , Pediatrician chosen   Psychotropic Medications:         Pediatrician:    [place]  Pediatrician List:   [facility] for [facility]      Pediatrician Fax Number:    Risk Factors/Current Problems:  Mental Health Concerns    Cognitive State:  Alert , Able to Concentrate , Linear Thinking , Insightful    Mood/Affect:  Bright , Happy , Interested , Comfortable    CSW Assessment: CSW met with MOB to complete an assessment for hx of anxiety and depression.  When CSW arrived MOB was  engaging in skin to skin.  MOB gave CSW permission to complete the assessment while MOB's mother, Tamara Harrell was present. CSW inquired about MOB's MH hx and MOB openly disclosed recently being dx with anxiety and depression.  MOB stated that MOB is not currently on any medication and does not have an outpatient counselor.  MOB denied currently having signs and symptoms and appeared to have insight and awareness about her MH.CSW provided education regarding Baby Blues vs PMADs and provided MOB with information about support groups held at [place] encouraged MOB to evaluate her mental health throughout the postpartum period with the use of the New Mom Checklist developed by [facility] and notify a medical professional if symptoms arise.     CSW was also open about MOB's SA hx.  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  MOB expressed losing custody of MOB's oldest son Tamara Harrell (08/23/14) was MOB's motivation to stop using drugs.  MOB reported that MOB's oldest son is currently residing in [place] with his paternal great aunt, and MOB's CPS case closed after MOB  voluntarily signed for [employer] to have temporary custody.   CSW confirmed that MOB does not currently have an open case with [facility] CPS.  There are no barriers to d/c.  CSW Plan/Description:  Perinatal Mood and Anxiety Disorder (PMADs) Education, Other Patient/Family Education, Other Information/Referral to [employer], Sudden Infant Death Syndrome (SIDS) Education, No Further Intervention Required/No Barriers to Discharge   Laurey Arrow, MSW, LCSW Clinical Social Work [phone]  Dimple Nanas, LCSW 05/28/2017, 1:47 PM

## 2017-05-28 NOTE — Lactation Note (Addendum)
This note was copied from a baby's chart. Lactation Consultation Note  Patient Name: Tamara Harrell [id number] Date: 05/28/2017 Reason for consult: Follow-up assessment;Infant weight loss;Late-preterm 34-36.6wks;Infant < 6lbs;Other (Comment) (milk is in, per mom plans  to pump and bottle feed )  Baby is 45 days old , today weight 5-1.5 oz ( 8.7 % weight loss ) up from yesterday at 5-2.4 oz . Per mom last pumped at 10:30 am and did not pump all night. Breast were really full , and pumped off 4 oz.  Last feeding was formula. LC encouraged mom to feed the baby the EBM , due to being the best for the baby and formula  Was 2nd best.  LC reviewed supply and demand/ importance of consistent pumping around the clock at least 8 x's a day and it may be 10 times in 24 hours depending how full she is. Sore nipple and engorgement prevention and tx reviewed.  Per mom had been in touch with WIC - [place]/ but didn't know if she could get a DEBP today.  LC recommended for her to call WIC/ [place] and mention the baby is at 9% weight loss, <6 pounds, LPT infant,  Milk is in, and mom planned to pump and bottle feed.  Mom called WIC/ [place] , and they planned to transfer over [employer] - can't get appt. Until tomorrow.  WIC loaner DEBP provided from [facility] - $30.00  LC instructed mom on the instructions for the DEBP for the maintaine  mode now that the milk is in bilaterally .  Mother informed of post-discharge support and given phone number to the lactation department, including services for phone call assistance; out-patient appointments; and breastfeeding support group. List of other breastfeeding resources in the community given in the handout. Encouraged mother to call for problems or concerns related to breastfeeding.    Maternal Data Has patient been taught Hand Expression?: Yes  Feeding Feeding Type: Formula Nipple Type: Slow - flow  LATCH Score                    Interventions Interventions: Breast feeding basics reviewed;DEBP  Lactation Tools Discussed/Used Tools: Pump Breast pump type: Double-Electric Breast Pump   Consult Status Consult Status: Complete Date: 05/28/17    Tamara Harrell Tamara Harrell 05/28/2017, 11:54 AM

## 2017-06-21 ENCOUNTER — Ambulatory Visit (INDEPENDENT_AMBULATORY_CARE_PROVIDER_SITE_OTHER): Payer: Medicaid Other | Admitting: Obstetrics & Gynecology

## 2017-06-21 VITALS — BP 112/80 | HR 99 | Ht 60.0 in | Wt 111.8 lb

## 2017-06-21 DIAGNOSIS — Z3202 Encounter for pregnancy test, result negative: Secondary | ICD-10-CM

## 2017-06-21 DIAGNOSIS — Z3049 Encounter for surveillance of other contraceptives: Secondary | ICD-10-CM

## 2017-06-21 DIAGNOSIS — Z3689 Encounter for other specified antenatal screening: Secondary | ICD-10-CM | POA: Diagnosis not present

## 2017-06-21 DIAGNOSIS — Z30017 Encounter for initial prescription of implantable subdermal contraceptive: Secondary | ICD-10-CM

## 2017-06-21 LAB — POCT URINE PREGNANCY: PREG TEST UR: NEGATIVE

## 2017-06-21 MED ORDER — ETONOGESTREL 68 MG ~~LOC~~ IMPL
68.0000 mg | DRUG_IMPLANT | Freq: Once | SUBCUTANEOUS | Status: AC
  Administered 2017-06-21: 68 mg via SUBCUTANEOUS

## 2017-06-21 NOTE — Progress Notes (Signed)
[facility]

## 2017-06-21 NOTE — Progress Notes (Signed)
Subjective:     Tamara BarrierKatelyn R Pisani is a 21 y.o. female who presents for a postpartum visit. She is 4 weeks postpartum following a spontaneous vaginal delivery. I have fully reviewed the prenatal and intrapartum course. The delivery was at 39 gestational weeks. Outcome: spontaneous vaginal delivery. Anesthesia: epidural. Postpartum course has been Normal. Baby's course has been normal . Baby is feeding by both breast and bottle - [employer] Good Start. Bleeding no bleeding. Bowel function is normal. Bladder function is normal. Patient is not sexually active. Contraception method is desires Nexplanon. Postpartum depression screening: negative.  The following portions of the patient's history were reviewed and updated as appropriate: allergies, current medications, past family history, past medical history, past social history, past surgical history and problem list.  Review of Systems Pertinent items are noted in HPI.   Objective:    BP 112/80   Pulse 99   Ht 5' (1.524 m)   Wt 111 lb 12.8 oz (50.7 kg)   Breastfeeding? Yes   BMI 21.83 kg/m   General:  alert   Breasts:  inspection negative, no nipple discharge or bleeding, no masses or nodularity palpable  Lungs: clear to auscultation bilaterally  Heart:  regular rate and rhythm, S1, S2 normal, no murmur, click, rub or gallop  Abdomen: soft, non-tender; bowel sounds normal; no masses,  no organomegaly   Vulva:  normal  Vagina: normal vagina  Cervix:  normal  Corpus: normal  Adnexa:  normal adnexa  Rectal Exam: Not performed.       UPT was negative. Consent was signed. Time out procedure was done. Her left arm was prepped with betadine and infiltrated with 3 cc of 1% lidocaine. After adequate anesthesia was assured, the Nexplanon device was placed according to standard of care. Her arm was hemostatic and was bandaged. She tolerated the procedure well.    Assessment:     Normal postpartum exam. Pap smear not done at today's visit.    Plan:    1. Contraception: desires Nexplanon 2. Rec back up method for 2 weeks 3. Follow up in: 1 year or as needed.

## 2017-06-26 ENCOUNTER — Encounter: Payer: Self-pay | Admitting: Obstetrics & Gynecology

## 2017-07-18 ENCOUNTER — Encounter: Payer: Self-pay | Admitting: *Deleted

## 2017-12-22 ENCOUNTER — Emergency Department (HOSPITAL_COMMUNITY)
Admission: EM | Admit: 2017-12-22 | Discharge: 2017-12-23 | Disposition: A | Discharge status: Home / Self Care | Payer: Self-pay | Attending: Emergency Medicine | Admitting: Emergency Medicine

## 2017-12-22 ENCOUNTER — Encounter (HOSPITAL_COMMUNITY): Payer: Self-pay | Admitting: Emergency Medicine

## 2017-12-22 DIAGNOSIS — R1011 Right upper quadrant pain: Secondary | ICD-10-CM | POA: Insufficient documentation

## 2017-12-22 DIAGNOSIS — F17210 Nicotine dependence, cigarettes, uncomplicated: Secondary | ICD-10-CM | POA: Insufficient documentation

## 2017-12-22 HISTORY — DX: Unspecified viral hepatitis C without hepatic coma: B19.20

## 2017-12-22 LAB — CBC
HEMATOCRIT: 44 % (ref 36.0–46.0)
Hemoglobin: 14.7 g/dL (ref 12.0–15.0)
MCH: 31.5 pg (ref 26.0–34.0)
MCHC: 33.4 g/dL (ref 30.0–36.0)
MCV: 94.2 fL (ref 78.0–100.0)
Platelets: 204 10*3/uL (ref 150–400)
RBC: 4.67 MIL/uL (ref 3.87–5.11)
RDW: 12 % (ref 11.5–15.5)
WBC: 6.4 10*3/uL (ref 4.0–10.5)

## 2017-12-22 LAB — COMPREHENSIVE METABOLIC PANEL
ALBUMIN: 4 g/dL (ref 3.5–5.0)
ALT: 85 U/L — ABNORMAL HIGH (ref 14–54)
AST: 59 U/L — AB (ref 15–41)
Alkaline Phosphatase: 91 U/L (ref 38–126)
Anion gap: 8 (ref 5–15)
BILIRUBIN TOTAL: 0.7 mg/dL (ref 0.3–1.2)
CHLORIDE: 104 mmol/L (ref 101–111)
CO2: 27 mmol/L (ref 22–32)
Calcium: 9 mg/dL (ref 8.9–10.3)
Creatinine, Ser: 0.75 mg/dL (ref 0.44–1.00)
GFR calc Af Amer: >60 mL/min (ref >60)
GFR calc non Af Amer: >60 mL/min (ref >60)
GLUCOSE: 94 mg/dL (ref 65–99)
POTASSIUM: 4.1 mmol/L (ref 3.5–5.1)
Sodium: 139 mmol/L (ref 135–145)
Total Protein: 7 g/dL (ref 6.5–8.1)

## 2017-12-22 LAB — URINALYSIS, ROUTINE W REFLEX MICROSCOPIC
Bilirubin Urine: NEGATIVE
Glucose, UA: NEGATIVE mg/dL
Hgb urine dipstick: NEGATIVE
Ketones, ur: NEGATIVE mg/dL
Nitrite: NEGATIVE
PROTEIN: NEGATIVE mg/dL
Specific Gravity, Urine: 1.01 (ref 1.005–1.030)
pH: 6 (ref 5.0–8.0)

## 2017-12-22 LAB — LIPASE, BLOOD: LIPASE: 24 U/L (ref 11–51)

## 2017-12-22 LAB — I-STAT TROPONIN, ED: Troponin i, poc: 0 ng/mL (ref 0.00–0.08)

## 2017-12-22 LAB — I-STAT BETA HCG BLOOD, ED (MC, WL, AP ONLY)

## 2017-12-22 MED ORDER — OXYCODONE-ACETAMINOPHEN 5-325 MG PO TABS
1.0000 | ORAL_TABLET | Freq: Once | ORAL | Status: AC
  Administered 2017-12-22: 1 via ORAL
  Filled 2017-12-22: qty 1

## 2017-12-22 NOTE — ED Provider Notes (Signed)
[facility] EMERGENCY DEPARTMENT Provider Note   CSN: [id number] Arrival date & time: 12/22/17  5784     History   Chief Complaint Chief Complaint  Patient presents with  . Abdominal Pain    HPI Tamara Harrell is a 22 y.o. female.  Patient presents for evaluation of RUQ pain that started one week ago. She denies nausea or vomiting but reports loss of appetite. She was diagnosed with hepatitis C 5 months ago and has not started treatment yet. She has a history of cholecystectomy 6 years ago. No cough, fever, or SOB, however, the pain is worse when she takes a deep breath. She is 6 months post-partum from an uncomplicated pregnancy delivered at 40 weeks. She is no longer breast feeding.   The history is provided by the patient. No language interpreter was used.    Past Medical History:  Diagnosis Date  . Anemia   . Anxiety   . Depression   . Hepatitis C     Patient Active Problem List   Diagnosis Date Noted  . Insufficient prenatal care 05/24/2017  . Encounter for supervision of normal pregnancy in second trimester 03/27/2017  . Late prenatal care affecting pregnancy 03/27/2017    Past Surgical History:  Procedure Laterality Date  . bone graft Right    arm  . LAPAROSCOPIC CHOLECYSTECTOMY       OB History    Gravida  2   Para  2   Term  1   Preterm  1   AB      Living  2     SAB      TAB      Ectopic      Multiple  0   Live Births  2        Obstetric Comments  G1: 08/2014, SVD 6lbs 5oz, no issues or problems.          Home Medications    Prior to Admission medications   Not on File    Family History Family History  Problem Relation Age of Onset  . Depression Mother   . Miscarriages / [place] Mother   . Vision loss Mother   . Varicose Veins Mother   . Drug abuse Father   . Cancer Maternal Aunt   . Arthritis Maternal Grandmother   . Cancer Maternal Grandmother   . Hypertension Maternal Grandmother   .  Cancer Maternal Grandfather     Social History Social History   Tobacco Use  . Smoking status: Current Every Day Smoker    Packs/day: 0.25    Types: Cigarettes  . Smokeless tobacco: Current User  Substance Use Topics  . Alcohol use: No  . Drug use: No     Allergies   Sulfa antibiotics   Review of Systems Review of Systems  Constitutional: Positive for appetite change. Negative for chills and fever.  HENT: Negative.   Respiratory: Negative for cough and shortness of breath.        See HPI.  Cardiovascular: Negative.  Negative for chest pain.  Gastrointestinal: Positive for abdominal pain. Negative for nausea and vomiting.  Genitourinary: Negative.   Musculoskeletal: Negative.   Skin: Negative.   Neurological: Negative.      Physical Exam Updated Vital Signs BP 113/73 (BP Location: Right Arm)   Pulse 75   Temp 98.8 F (37.1 C) (Oral)   Resp 16   LMP 12/12/2017   SpO2 99%   Physical Exam  Constitutional: She  appears well-developed and well-nourished.  HENT:  Head: Normocephalic.  Neck: Normal range of motion. Neck supple.  Cardiovascular: Normal rate and regular rhythm.  Pulmonary/Chest: Effort normal and breath sounds normal.  Abdominal: Soft. Bowel sounds are normal. There is tenderness in the right upper quadrant. There is no rebound and no guarding.    Musculoskeletal: Normal range of motion.  Neurological: She is alert. No cranial nerve deficit.  Skin: Skin is warm and dry. No rash noted.  Psychiatric: She has a normal mood and affect.     ED Treatments / Results  Labs (all labs ordered are listed, but only abnormal results are displayed) Labs Reviewed  COMPREHENSIVE METABOLIC PANEL - Abnormal; Notable for the following components:      Result Value   BUN <5 (*)    AST 59 (*)    ALT 85 (*)    All other components within normal limits  URINALYSIS, ROUTINE W REFLEX MICROSCOPIC - Abnormal; Notable for the following components:   Leukocytes, UA  TRACE (*)    Bacteria, UA RARE (*)    All other components within normal limits  LIPASE, BLOOD  CBC  I-STAT BETA HCG BLOOD, ED ([facility], [facility], [facility] ONLY)  I-STAT TROPONIN, ED    EKG None  Radiology No results found.  Procedures Procedures (including critical care time)  Medications Ordered in ED Medications - No data to display   Initial Impression / Assessment and Plan / ED Course  I have reviewed the triage vital signs and the nursing notes.  Pertinent labs & imaging results that were available during my care of the patient were reviewed by me and considered in my medical decision making (see chart for details).     Patient is here with complaint of RUQ abdominal and lower right chest pain. She is concerned about a recent diagnoses of Hep C that has not been treated yet.   VSS. Patient has no risk factors for PE, no tachycardia, hypoxia. She is not on estrogens, no recent travel, no LE pain. Pain is reproducible. Doubt PE. CXR clear - no PNA or infectious process. Labs reassuring - minimal transaminase elevation with normal total bilirubin. No N, V, weight loss.  Doubt pain is related to Hepatitis infection. She has had a cholecystectomy.   She was given a percocet for pain and reports feeling better. Feel she can be discharged home and is encouraged to follow up with PCP for further management. Will provide referrals.   Final Clinical Impressions(s) / ED Diagnoses   Final diagnoses:  None   1. RUQ abdominal pain  ED Discharge Orders    None       Elpidio Anis, PA-C 12/23/17 1610    Alvira Monday, MD 12/23/17 1141

## 2017-12-22 NOTE — ED Notes (Signed)
ED Provider at bedside. 

## 2017-12-22 NOTE — ED Triage Notes (Signed)
Patient presents to eD for assessment of RUQ abdominal pain that radiates into her chest and flank with some SOB.  States Hepatitis C diagnosis since January, but no finances to treat at this time.  No hx of asthma or clots, no recent trips

## 2017-12-23 ENCOUNTER — Emergency Department (HOSPITAL_COMMUNITY): Payer: Self-pay

## 2017-12-23 MED ORDER — OXYCODONE-ACETAMINOPHEN 5-325 MG PO TABS: 1.0000 | ORAL_TABLET | ORAL | 0 refills | Status: DC | PRN

## 2017-12-23 NOTE — ED Notes (Signed)
Pt understood dc material. NAD noted. Script given at dc  

## 2017-12-23 NOTE — Discharge Instructions (Signed)
Your tests and imaging are negative. You can be discharged home and will need to continue the work up in the outpatient setting to find the cause of your pain. Return to the emergency department with any high fever, severe pain, or new concern.

## 2019-05-11 ENCOUNTER — Other Ambulatory Visit: Payer: Self-pay

## 2019-05-11 ENCOUNTER — Emergency Department (HOSPITAL_COMMUNITY)
Admission: EM | Admit: 2019-05-11 | Discharge: 2019-05-11 | Disposition: A | Discharge status: Home / Self Care | Payer: Self-pay | Attending: Emergency Medicine | Admitting: Emergency Medicine

## 2019-05-11 ENCOUNTER — Encounter (HOSPITAL_COMMUNITY): Payer: Self-pay | Admitting: Emergency Medicine

## 2019-05-11 DIAGNOSIS — K0889 Other specified disorders of teeth and supporting structures: Secondary | ICD-10-CM | POA: Insufficient documentation

## 2019-05-11 DIAGNOSIS — F17210 Nicotine dependence, cigarettes, uncomplicated: Secondary | ICD-10-CM | POA: Insufficient documentation

## 2019-05-11 MED ORDER — PENICILLIN V POTASSIUM 500 MG PO TABS: 500.0000 mg | ORAL_TABLET | Freq: Four times a day (QID) | ORAL | 0 refills | Status: AC

## 2019-05-11 NOTE — ED Notes (Signed)
Reports tooth pain today  Pt has very poor dental care with missing teeth caries throughout mouth  Large amount of tarter on and between teeth  Pt reports she just moved from [facility]  She will need [place] free dental info

## 2019-05-11 NOTE — Discharge Instructions (Addendum)
You were given a prescription for antibiotics. Please take the antibiotic prescription fully.   Please follow-up with a dentist in the next 5 to 7 days for reevaluation.  If you do not have a dentist, resources were provided for dentist in the area in your discharge summary.  Please contact one of the offices that are listed and make an appointment for follow-up.  Please return to the emergency department for any new or worsening symptoms.  

## 2019-05-11 NOTE — ED Triage Notes (Signed)
Patient c/o right lower dental pain that started today. Patient states started today while eating, went to brush teeth and then severe pain started. Denies any fever.

## 2019-05-11 NOTE — ED Provider Notes (Signed)
[facility] EMERGENCY DEPARTMENT Provider Note   CSN: [id number] Arrival date & time: 05/11/19  1744     History   Chief Complaint Chief Complaint  Patient presents with  . Dental Pain    HPI Tamara Harrell is a 23 y.o. female.     HPI   Patient is a 23 year old female history of anemia, anxiety, depression, hep C, who presents the emergency department today complaining of right lower dental pain that started today.  States it started while she was eating.  She try to brush her teeth after that and pain was worse.  Pain is constant and severe in nature.  She tried taking Tylenol and Aleve prior to arrival which helped a little bit but did not resolve her symptoms.  She denies any fevers, difficulty swallowing or opening her mouth.  She does not have a Education officer, communitydentist.  Past Medical History:  Diagnosis Date  . Anemia   . Anxiety   . Depression   . Hepatitis C     Patient Active Problem List   Diagnosis Date Noted  . Insufficient prenatal care 05/24/2017  . Encounter for supervision of normal pregnancy in second trimester 03/27/2017  . Late prenatal care affecting pregnancy 03/27/2017    Past Surgical History:  Procedure Laterality Date  . bone graft Right    arm  . LAPAROSCOPIC CHOLECYSTECTOMY       OB History    Gravida  2   Para  2   Term  1   Preterm  1   AB      Living  2     SAB      TAB      Ectopic      Multiple  0   Live Births  2        Obstetric Comments  G1: 08/2014, SVD 6lbs 5oz, no issues or problems.          Home Medications    Prior to Admission medications   Medication Sig Start Date End Date Taking? Authorizing Provider  oxyCODONE-acetaminophen (PERCOCET/ROXICET) 5-325 MG tablet Take 1 tablet by mouth every 4 (four) hours as needed for severe pain. 12/23/17   Elpidio AnisUpstill, Shari, PA-C  penicillin v potassium (VEETID) 500 MG tablet Take 1 tablet (500 mg total) by mouth 4 (four) times daily for 7 days. 05/11/19 05/18/19  Vercie Pokorny,  Janaiya Beauchesne S, PA-C    Family History Family History  Problem Relation Age of Onset  . Depression Mother   . Miscarriages / [place] Mother   . Vision loss Mother   . Varicose Veins Mother   . Drug abuse Father   . Cancer Maternal Aunt   . Arthritis Maternal Grandmother   . Cancer Maternal Grandmother   . Hypertension Maternal Grandmother   . Cancer Maternal Grandfather     Social History Social History   Tobacco Use  . Smoking status: Current Every Day Smoker    Packs/day: 0.25    Types: Cigarettes  . Smokeless tobacco: Never Used  Substance Use Topics  . Alcohol use: No  . Drug use: No     Allergies   Sulfa antibiotics   Review of Systems Review of Systems  Constitutional: Negative for fever.  HENT: Positive for dental problem.      Physical Exam Updated Vital Signs BP 117/68 (BP Location: Left Arm)   Pulse 84   Temp 98.8 F (37.1 C) (Oral)   Resp 18   Ht 5' (1.524 m)  Wt 45.4 kg   SpO2 100%   BMI 19.53 kg/m   Physical Exam Vitals signs and nursing note reviewed.  Constitutional:      General: She is not in acute distress.    Appearance: She is well-developed.  HENT:     Head: Normocephalic and atraumatic.     Mouth/Throat:     Comments: Multiple fractured teeth and significant amount of dental caries.  Tender to percussion to majority of teeth to the right lower mouth.  No periapical abscess or fluctuance to the gumline.  No sublingual swelling or submandibular swelling.  No trismus. Eyes:     Conjunctiva/sclera: Conjunctivae normal.  Neck:     Musculoskeletal: Neck supple.  Cardiovascular:     Rate and Rhythm: Normal rate.  Pulmonary:     Effort: Pulmonary effort is normal.  Musculoskeletal: Normal range of motion.  Skin:    General: Skin is warm and dry.  Neurological:     Mental Status: She is alert.      ED Treatments / Results  Labs (all labs ordered are listed, but only abnormal results are displayed) Labs Reviewed - No data  to display  EKG None  Radiology No results found.  Procedures Procedures (including critical care time)  Medications Ordered in ED Medications - No data to display   Initial Impression / Assessment and Plan / ED Course  I have reviewed the triage vital signs and the nursing notes.  Pertinent labs & imaging results that were available during my care of the patient were reviewed by me and considered in my medical decision making (see chart for details).      Final Clinical Impressions(s) / ED Diagnoses   Final diagnoses:  Pain, dental   Patient with toothache.  No gross abscess.  Exam unconcerning for Ludwig's angina or spread of infection.  Will treat with penicillin and pain medicine.  Urged patient to follow-up with dentist.     ED Discharge Orders         Ordered    penicillin v potassium (VEETID) 500 MG tablet  4 times daily     05/11/19 1950           Rodney Booze, PA-C 05/11/19 [place], [place], DO 05/15/19 1605

## 2019-07-29 ENCOUNTER — Other Ambulatory Visit: Payer: Self-pay

## 2019-07-29 DIAGNOSIS — Z20822 Contact with and (suspected) exposure to covid-19: Secondary | ICD-10-CM

## 2019-07-31 LAB — NOVEL CORONAVIRUS, NAA: SARS-CoV-2, NAA: NOT DETECTED

## 2019-09-19 ENCOUNTER — Other Ambulatory Visit: Payer: Self-pay

## 2019-09-19 ENCOUNTER — Ambulatory Visit
Admission: EM | Admit: 2019-09-19 | Discharge: 2019-09-19 | Disposition: A | Discharge status: Home / Self Care | Payer: Self-pay | Attending: Emergency Medicine | Admitting: Emergency Medicine

## 2019-09-19 DIAGNOSIS — R109 Unspecified abdominal pain: Secondary | ICD-10-CM | POA: Insufficient documentation

## 2019-09-19 DIAGNOSIS — R300 Dysuria: Secondary | ICD-10-CM | POA: Insufficient documentation

## 2019-09-19 LAB — POCT URINALYSIS DIP (MANUAL ENTRY)
Bilirubin, UA: NEGATIVE
Glucose, UA: NEGATIVE mg/dL
Ketones, POC UA: NEGATIVE mg/dL
Leukocytes, UA: NEGATIVE
Nitrite, UA: NEGATIVE
Protein Ur, POC: NEGATIVE mg/dL
Spec Grav, UA: 1.02 (ref 1.010–1.025)
Urobilinogen, UA: 0.2 E.U./dL
pH, UA: 7 (ref 5.0–8.0)

## 2019-09-19 LAB — POCT URINE PREGNANCY: Preg Test, Ur: NEGATIVE

## 2019-09-19 NOTE — Discharge Instructions (Addendum)
POC pregnancy test was negative POC urine analysis show moderate amount of red blood cell Urine culture sent.  We will call you with the results.   Push fluids and get plenty of rest.   Follow up with PCP if symptoms persists Return here or go to ER if you have any new or worsening symptoms such as fever, worsening abdominal pain, nausea/vomiting, flank pain

## 2019-09-19 NOTE — ED Provider Notes (Addendum)
[facility]    CSN: [id number] Arrival date & time: 09/19/19  1807      History   Chief Complaint Chief Complaint  Patient presents with  . Abdominal Cramping  . Dysuria    HPI Tamara Harrell is a 24 y.o. female.   Presented to the urgent care with a complaint complains of dysuria, lower abdominal pain and cramping that began the past 2 days.  She denies a precipitating event or recent sexual encounter.  She has not tried any medication.  Her symptoms are made worse with urination.  She reports similar symptoms in the past that improved with antibiotic treatment.  She complains of decreased amount, increased urgency.  She denies fever, chills, nausea, vomiting, abdominal pain, flank pain, hematuria, or incontinence.  Patient is currently on her menstrual period.     Past Medical History:  Diagnosis Date  . Anemia   . Anxiety   . Depression   . Hepatitis C     Patient Active Problem List   Diagnosis Date Noted  . Insufficient prenatal care 05/24/2017  . Encounter for supervision of normal pregnancy in second trimester 03/27/2017  . Late prenatal care affecting pregnancy 03/27/2017    Past Surgical History:  Procedure Laterality Date  . bone graft Right    arm  . LAPAROSCOPIC CHOLECYSTECTOMY      OB History    Gravida  2   Para  2   Term  1   Preterm  1   AB      Living  2     SAB      TAB      Ectopic      Multiple  0   Live Births  2        Obstetric Comments  G1: 08/2014, SVD 6lbs 5oz, no issues or problems.          Home Medications    Prior to Admission medications   Medication Sig Start Date End Date Taking? Authorizing Provider  oxyCODONE-acetaminophen (PERCOCET/ROXICET) 5-325 MG tablet Take 1 tablet by mouth every 4 (four) hours as needed for severe pain. 12/23/17   Charlann Lange, PA-C    Family History Family History  Problem Relation Age of Onset  . Depression Mother   . Miscarriages / [place] Mother     . Vision loss Mother   . Varicose Veins Mother   . Drug abuse Father   . Cancer Maternal Aunt   . Arthritis Maternal Grandmother   . Cancer Maternal Grandmother   . Hypertension Maternal Grandmother   . Cancer Maternal Grandfather     Social History Social History   Tobacco Use  . Smoking status: Current Every Day Smoker    Packs/day: 0.25    Types: Cigarettes  . Smokeless tobacco: Never Used  Substance Use Topics  . Alcohol use: No  . Drug use: No     Allergies   Sulfa antibiotics   Review of Systems Review of Systems  Constitutional: Negative.   Respiratory: Negative.   Cardiovascular: Negative.   Gastrointestinal: Positive for abdominal pain.  Genitourinary: Positive for dysuria.  All other systems reviewed and are negative.    Physical Exam Triage Vital Signs ED Triage Vitals  Enc Vitals Group     BP 09/19/19 1820 114/74     Pulse Rate 09/19/19 1820 82     Resp 09/19/19 1820 18     Temp 09/19/19 1820 98.4 F (36.9 C)  Temp src --      SpO2 09/19/19 1820 96 %     Weight --      Height --      Head Circumference --      Peak Flow --      Pain Score 09/19/19 1817 6     Pain Loc --      Pain Edu? --      Excl. in GC? --    No data found.  Updated Vital Signs BP 114/74   Pulse 82   Temp 98.4 F (36.9 C)   Resp 18   LMP 09/17/2019   SpO2 96%   Visual Acuity Right Eye Distance:   Left Eye Distance:   Bilateral Distance:    Right Eye Near:   Left Eye Near:    Bilateral Near:     Physical Exam Vitals and nursing note reviewed.  Constitutional:      General: She is not in acute distress.    Appearance: Normal appearance. She is normal weight. She is not ill-appearing or toxic-appearing.  Cardiovascular:     Rate and Rhythm: Normal rate and regular rhythm.     Pulses: Normal pulses.     Heart sounds: Normal heart sounds. No murmur. No gallop.   Pulmonary:     Effort: Pulmonary effort is normal. No respiratory distress.     Breath  sounds: Normal breath sounds. No wheezing.  Chest:     Chest wall: No tenderness.  Abdominal:     General: Abdomen is flat. Bowel sounds are normal. There is no distension.     Palpations: Abdomen is soft. There is no mass.     Tenderness: There is no abdominal tenderness. There is no right CVA tenderness, left CVA tenderness, guarding or rebound.     Hernia: No hernia is present.  Genitourinary:    Vagina: No vaginal discharge.  Neurological:     Mental Status: She is alert.      UC Treatments / Results  Labs (all labs ordered are listed, but only abnormal results are displayed) Labs Reviewed  POCT URINALYSIS DIP (MANUAL ENTRY) - Abnormal; Notable for the following components:      Result Value   Blood, UA moderate (*)    All other components within normal limits  URINE CULTURE  POCT URINE PREGNANCY    EKG   Radiology No results found.  Procedures Procedures (including critical care time)  Medications Ordered in UC Medications - No data to display  Initial Impression / Assessment and Plan / UC Course  I have reviewed the triage vital signs and the nursing notes.  Pertinent labs & imaging results that were available during my care of the patient were reviewed by me and considered in my medical decision making (see chart for details).    POC urine analysis test was ordered and result was reviewed.  Results show moderate amount of red blood cell.  Sample will be sent for culture. POC urine pregnancy test was ordered and result was negative. Advised patient to increase fluid intake To return for worsening of symptoms  Final Clinical Impressions(s) / UC Diagnoses   Final diagnoses:  Dysuria  Abdominal cramping     Discharge Instructions     POC pregnancy test was negative POC urine analysis show moderate amount of red blood cell Urine culture sent.  We will call you with the results.   Push fluids and get plenty of rest.   Follow up with PCP  if symptoms  persists Return here or go to ER if you have any new or worsening symptoms such as fever, worsening abdominal pain, nausea/vomiting, flank pain     ED Prescriptions    None     PDMP not reviewed this encounter.   Durward Parcel, FNP 09/19/19 1840    Durward Parcel, FNP 09/19/19 1844

## 2019-09-19 NOTE — ED Triage Notes (Signed)
Pt presents with c/o lower abdominal pain, states it feels like period cramps but worse, pt is on her period now and has implant . Pt also having some dysuria

## 2019-09-21 LAB — URINE CULTURE

## 2020-02-27 ENCOUNTER — Other Ambulatory Visit: Payer: Self-pay

## 2020-02-27 ENCOUNTER — Emergency Department (HOSPITAL_COMMUNITY)
Admission: EM | Admit: 2020-02-27 | Discharge: 2020-02-27 | Disposition: A | Discharge status: Home / Self Care | Payer: Self-pay | Attending: Emergency Medicine | Admitting: Emergency Medicine

## 2020-02-27 ENCOUNTER — Emergency Department (HOSPITAL_COMMUNITY): Payer: Self-pay

## 2020-02-27 ENCOUNTER — Encounter (HOSPITAL_COMMUNITY): Payer: Self-pay

## 2020-02-27 DIAGNOSIS — Y939 Activity, unspecified: Secondary | ICD-10-CM | POA: Insufficient documentation

## 2020-02-27 DIAGNOSIS — Y9289 Other specified places as the place of occurrence of the external cause: Secondary | ICD-10-CM | POA: Insufficient documentation

## 2020-02-27 DIAGNOSIS — Y999 Unspecified external cause status: Secondary | ICD-10-CM | POA: Insufficient documentation

## 2020-02-27 DIAGNOSIS — Y929 Unspecified place or not applicable: Secondary | ICD-10-CM | POA: Insufficient documentation

## 2020-02-27 DIAGNOSIS — W228XXA Striking against or struck by other objects, initial encounter: Secondary | ICD-10-CM | POA: Insufficient documentation

## 2020-02-27 DIAGNOSIS — S92324A Nondisplaced fracture of second metatarsal bone, right foot, initial encounter for closed fracture: Secondary | ICD-10-CM | POA: Insufficient documentation

## 2020-02-27 MED ORDER — HYDROCODONE-ACETAMINOPHEN 5-325 MG PO TABS: 1.0000 | ORAL_TABLET | Freq: Four times a day (QID) | ORAL | 0 refills | Status: DC | PRN

## 2020-02-27 NOTE — ED Notes (Signed)
Tripped over door frame one week ago at [facility]   Has wrapped in ACE   Used crutches and   Used OTC meds   Here for eval as it is not improving

## 2020-02-27 NOTE — ED Triage Notes (Signed)
Pt to er, pt states that she hurt her foot about a week ago at a cookout, states that she jammed her foot in a wall/door, pt c/o R foot pain, states that she has wrapped it and tried ice and motrin, however, the pain doesn't seem to be getting any better.

## 2020-02-27 NOTE — Discharge Instructions (Addendum)
Wear the postop shoe at all times when weight bearing avoid bending your foot as discussed.  Continue to elevate as much as possible, ice can also be helpful for pain and swelling.  You may continue using ibuprofen.  I have prescribed you hydrocodone if needed for severe pain, this will make you drowsy.  Do not drive within 4 hours of taking this medication.

## 2020-02-29 NOTE — ED Provider Notes (Signed)
[facility] EMERGENCY DEPARTMENT Provider Note   CSN: [id number] Arrival date & time: 02/27/20  1524     History Chief Complaint  Patient presents with  . Foot Pain    Tamara Harrell is a 24 y.o. female  The history is provided by the patient.  Foot Pain This is a new problem. Episode onset: Stubbed her 2nd right toe on a door . The problem occurs constantly (constant pain with weight bearing). The problem has not changed since onset.The symptoms are aggravated by bending and walking. Nothing relieves the symptoms. She has tried a cold compress (ibuprofen and wrapping the foot) for the symptoms. The treatment provided no relief.       Past Medical History:  Diagnosis Date  . Anemia   . Anxiety   . Depression   . Hepatitis C     Patient Active Problem List   Diagnosis Date Noted  . Insufficient prenatal care 05/24/2017  . Encounter for supervision of normal pregnancy in second trimester 03/27/2017  . Late prenatal care affecting pregnancy 03/27/2017    Past Surgical History:  Procedure Laterality Date  . bone graft Right    arm  . LAPAROSCOPIC CHOLECYSTECTOMY       OB History    Gravida  2   Para  2   Term  1   Preterm  1   AB      Living  2     SAB      TAB      Ectopic      Multiple  0   Live Births  2        Obstetric Comments  G1: 08/2014, SVD 6lbs 5oz, no issues or problems.         Family History  Problem Relation Age of Onset  . Depression Mother   . Miscarriages / [place] Mother   . Vision loss Mother   . Varicose Veins Mother   . Drug abuse Father   . Cancer Maternal Aunt   . Arthritis Maternal Grandmother   . Cancer Maternal Grandmother   . Hypertension Maternal Grandmother   . Cancer Maternal Grandfather     Social History   Tobacco Use  . Smoking status: Current Every Day Smoker    Packs/day: 0.25    Types: Cigarettes  . Smokeless tobacco: Never Used  Substance Use Topics  . Alcohol use: No  . Drug use:  No    Home Medications Prior to Admission medications   Medication Sig Start Date End Date Taking? Authorizing Provider  HYDROcodone-acetaminophen (NORCO/VICODIN) 5-325 MG tablet Take 1 tablet by mouth every 6 (six) hours as needed for severe pain. 02/27/20   Burgess Amor, PA-C  oxyCODONE-acetaminophen (PERCOCET/ROXICET) 5-325 MG tablet Take 1 tablet by mouth every 4 (four) hours as needed for severe pain. 12/23/17   Elpidio Anis, PA-C    Allergies    Sulfa antibiotics  Review of Systems   Review of Systems  Constitutional: Negative for fever.  Musculoskeletal: Positive for arthralgias and joint swelling. Negative for myalgias.  Neurological: Negative for weakness and numbness.    Physical Exam Updated Vital Signs BP 124/87 (BP Location: Right Arm)   Pulse 100   Temp 98.5 F (36.9 C) (Oral)   Resp 18   Ht 5' (1.524 m)   Wt 46.3 kg   SpO2 99%   BMI 19.92 kg/m   Physical Exam Constitutional:      Appearance: She is well-developed.  HENT:  Head: Atraumatic.  Cardiovascular:     Comments: Pulses equal bilaterally Musculoskeletal:        General: Tenderness present.     Cervical back: Normal range of motion.     Right foot: Normal capillary refill. Bony tenderness present. No deformity or crepitus.     Comments: ttp localizing to the right 2nd metatarsal head. Mild edema, no ecchymosis. Distal sensation intact with less than 2 sec cap refill.  No deformity.  Dorsalis pedis pulse intact. Skin intact.   Skin:    General: Skin is warm and dry.  Neurological:     Mental Status: She is alert.     Sensory: No sensory deficit.     Deep Tendon Reflexes: Reflexes normal.     ED Results / Procedures / Treatments   Labs (all labs ordered are listed, but only abnormal results are displayed) Labs Reviewed - No data to display  EKG None  Radiology DG Foot Complete Right  Result Date: 02/27/2020 CLINICAL DATA:  Pain. EXAM: RIGHT FOOT COMPLETE - 3+ VIEW COMPARISON:  None.  FINDINGS: There appears to be an acute, slightly impacted, nondisplaced fracture through the head of the second metatarsal. There is surrounding soft tissue swelling. There is no evidence for dislocation. There is soft tissue swelling about the forefoot. IMPRESSION: Acute, slightly impacted, nondisplaced fracture through the head of the second metatarsal. Electronically Signed   By: Katherine Mantle M.D.   On: 02/27/2020 16:40    Procedures Procedures (including critical care time)  Medications Ordered in ED Medications - No data to display  ED Course  I have reviewed the triage vital signs and the nursing notes.  Pertinent labs & imaging results that were available during my care of the patient were reviewed by me and considered in my medical decision making (see chart for details).    MDM Rules/Calculators/A&P                          Imaging reviewed, shown and discussed with pt. She was placed in a post op shoe. Advised wearing at all times when awake and weight bearing.  She has crutches with her, advised to continue using to minimize weight bearing.  Referral to ortho for recheck within one week.  Final Clinical Impression(s) / ED Diagnoses Final diagnoses:  Closed nondisplaced fracture of second metatarsal bone of right foot, initial encounter    Rx / DC Orders ED Discharge Orders         Ordered    HYDROcodone-acetaminophen (NORCO/VICODIN) 5-325 MG tablet  Every 6 hours PRN     Discontinue  Reprint     02/27/20 1927           Burgess Amor, PA-C 02/29/20 1231    Sabas Sous, MD 02/29/20 [phone]

## 2020-06-28 ENCOUNTER — Encounter: Payer: Self-pay | Admitting: Emergency Medicine

## 2020-06-28 ENCOUNTER — Other Ambulatory Visit: Payer: Self-pay

## 2020-06-28 ENCOUNTER — Ambulatory Visit
Admission: EM | Admit: 2020-06-28 | Discharge: 2020-06-28 | Disposition: A | Discharge status: Home / Self Care | Payer: Medicaid Other | Attending: Emergency Medicine | Admitting: Emergency Medicine

## 2020-06-28 DIAGNOSIS — M25561 Pain in right knee: Secondary | ICD-10-CM | POA: Diagnosis not present

## 2020-06-28 DIAGNOSIS — M25562 Pain in left knee: Secondary | ICD-10-CM | POA: Diagnosis not present

## 2020-06-28 MED ORDER — PREDNISONE 20 MG PO TABS: 20.0000 mg | ORAL_TABLET | Freq: Two times a day (BID) | ORAL | 0 refills | Status: AC

## 2020-06-28 NOTE — ED Triage Notes (Signed)
Pain in all of her joints, started with knees. Tired, sweating, shaking. Pain feels like electrical shocks.

## 2020-06-28 NOTE — ED Provider Notes (Signed)
[facility]   [id number] 06/28/20 Arrival Time: 1351  [id number] complaints (biggest concern for bilateral knee pain)  SUBJECTIVE: History from: patient. Tamara Harrell is a 24 y.o. female complains of bilateral knee pain x 2 weeks, worse over the past 2 days.  Denies a precipitating event or specific injury.  Denies tick bite.  Localizes the pain to the inside of bilateral knees.  Describes the pain as intermittent and tight in character.  Has tried rest without relief.  Symptoms are made worse with activity.    Also complains of multiple joint pain, difficulty concentrating, electrical sensation under skin, and excessive sweating x 2 weeks.  Worse over the past 2 days.  Denies precipitating event.  Worse with activity.  Has tried rest without relief.    Complains of chills, nausea, and joint redness/ swelling.    Denies fever, ecchymosis, weakness, numbness and tingling, loss of bowel or bladder function.      ROS: As per HPI.  All other pertinent ROS negative.     Past Medical History:  Diagnosis Date  . Anemia   . Anxiety   . Depression   . Hepatitis C    Past Surgical History:  Procedure Laterality Date  . bone graft Right    arm  . LAPAROSCOPIC CHOLECYSTECTOMY     Allergies  Allergen Reactions  . Sulfa Antibiotics Nausea And Vomiting and Other (See Comments)    Pain, body aches, headache, fever   No current facility-administered medications on file prior to encounter.   Current Outpatient Medications on File Prior to Encounter  Medication Sig Dispense Refill  . HYDROcodone-acetaminophen (NORCO/VICODIN) 5-325 MG tablet Take 1 tablet by mouth every 6 (six) hours as needed for severe pain. 15 tablet 0  . oxyCODONE-acetaminophen (PERCOCET/ROXICET) 5-325 MG tablet Take 1 tablet by mouth every 4 (four) hours as needed for severe pain. 5 tablet 0   Social History   Socioeconomic History  . Marital status: Single    Spouse name: Not on file  . Number of  children: Not on file  . Years of education: Not on file  . Highest education level: Not on file  Occupational History  . Not on file  Tobacco Use  . Smoking status: Current Every Day Smoker    Packs/day: 0.25    Types: Cigarettes  . Smokeless tobacco: Never Used  Substance and Sexual Activity  . Alcohol use: No  . Drug use: No  . Sexual activity: Yes  Other Topics Concern  . Not on file  Social History Narrative  . Not on file   Social Determinants of Health   Financial Resource Strain:   . Difficulty of Paying Living Expenses: Not on file  Food Insecurity:   . Worried About Programme researcher, broadcasting/film/video in the Last Year: Not on file  . Ran Out of Food in the Last Year: Not on file  Transportation Needs:   . Lack of Transportation (Medical): Not on file  . Lack of Transportation (Non-Medical): Not on file  Physical Activity:   . Days of Exercise per Week: Not on file  . Minutes of Exercise per Session: Not on file  Stress:   . Feeling of Stress : Not on file  Social Connections:   . Frequency of Communication with Friends and Family: Not on file  . Frequency of Social Gatherings with Friends and Family: Not on file  . Attends Religious Services: Not on file  . Active Member of Clubs or  Organizations: Not on file  . Attends Banker Meetings: Not on file  . Marital Status: Not on file  Intimate Partner Violence:   . Fear of Current or Ex-Partner: Not on file  . Emotionally Abused: Not on file  . Physically Abused: Not on file  . Sexually Abused: Not on file   Family History  Problem Relation Age of Onset  . Depression Mother   . Miscarriages / [place] Mother   . Vision loss Mother   . Varicose Veins Mother   . Drug abuse Father   . Cancer Maternal Aunt   . Arthritis Maternal Grandmother   . Cancer Maternal Grandmother   . Hypertension Maternal Grandmother   . Cancer Maternal Grandfather     OBJECTIVE:  Vitals:   06/28/20 1406 06/28/20 1408  BP:  (!) 149/96   Pulse: 87   Resp: 19   Temp: 98.5 F (36.9 C)   TempSrc: Oral   SpO2: 100%   Weight:  110 lb (49.9 kg)  Height:  5' (1.524 m)    General appearance: ALERT; in no acute distress.  Head: NCAT ENT: Pupils fixed; nares patent; dentition; poor, oropharynx clear Lungs: Normal respiratory effort; CTAB CV: RRR Musculoskeletal: Bilateral knee Inspection: Skin warm, dry, clear and intact without obvious erythema, effusion, or ecchymosis.  Palpation: Diffusely TTP over medial joint line of bilateral knees ROM: FROM active and passive Abdomen: soft, nondistended, normal active bowel sounds; nontender to palpation; no guarding  Skin: warm and dry Neurologic: Ambulates without difficulty; Sensation intact about the upper/ lower extremities Psychological: alert and cooperative; normal mood and affect   ASSESSMENT & PLAN:  1. Arthralgia of both knees      Meds ordered this encounter  Medications  . predniSONE (DELTASONE) 20 MG tablet    Sig: Take 1 tablet (20 mg total) by mouth 2 (two) times daily with a meal for 5 days.    Dispense:  10 tablet    Refill:  0    Order Specific Question:   Supervising Provider    Answer:   Eustace Moore [[id number]]    Continue conservative management of rest, ice, and elevation Prednisone prescribed.   We will hold off on blood work today Patient will go to the ED/ ER if symptoms do not improve with medication.   Return or go to the ER if you have any new or worsening symptoms (fever, chills, chest pain, fainting, shortness of breath, slurred speech, facial droop, weakness in arms or legs, abdominal pain, changes in bowel or bladder habits, increased redness/ swelling, etc...)   Reviewed expectations re: course of current medical issues. Questions answered. Outlined signs and symptoms indicating need for more acute intervention. Patient verbalized understanding. After Visit Summary given.    Rennis Harding, PA-C 06/28/20 1458

## 2020-06-28 NOTE — Discharge Instructions (Addendum)
Continue conservative management of rest, ice, and elevation Prednisone prescribed.   We will hold off on blood work today Patient will go to the ED/ ER if symptoms do not improve with medication.   Return or go to the ER if you have any new or worsening symptoms (fever, chills, chest pain, fainting, shortness of breath, slurred speech, facial droop, weakness in arms or legs, abdominal pain, changes in bowel or bladder habits, increased redness/ swelling, etc...)
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
## 2020-07-01 ENCOUNTER — Encounter: Payer: Self-pay | Admitting: Nurse Practitioner

## 2020-07-01 ENCOUNTER — Ambulatory Visit (INDEPENDENT_AMBULATORY_CARE_PROVIDER_SITE_OTHER): Payer: Medicaid Other | Admitting: Nurse Practitioner

## 2020-07-01 ENCOUNTER — Other Ambulatory Visit: Payer: Self-pay

## 2020-07-01 VITALS — BP 127/73 | HR 112 | Temp 98.9°F | Resp 16 | Ht 60.0 in | Wt 107.4 lb

## 2020-07-01 DIAGNOSIS — Z124 Encounter for screening for malignant neoplasm of cervix: Secondary | ICD-10-CM | POA: Diagnosis not present

## 2020-07-01 DIAGNOSIS — M2550 Pain in unspecified joint: Secondary | ICD-10-CM

## 2020-07-01 MED ORDER — KETOROLAC TROMETHAMINE 10 MG PO TABS: 10.0000 mg | ORAL_TABLET | Freq: Three times a day (TID) | ORAL | Status: AC | PRN

## 2020-07-01 NOTE — Patient Instructions (Signed)
Here today to establish care, but she has acute bilateral knee pain.  For knee pain, we will check labs as well as start ketorolac.  We discussed not taking ibuprofen, motrin, or aleve while taking this.  She may take tylenol.   We will follow-up in 1 week to review labs and have a physical.

## 2020-07-07 NOTE — Progress Notes (Deleted)
Established Patient Office Visit  Subjective:  Patient ID: Tamara Harrell, female    DOB: 15-Mar-1996  Age: 24 y.o. MRN: [id number]  CC: No chief complaint on file.   HPI Tamara Harrell presents for annual physical exam.  Past Medical History:  Diagnosis Date  . Anemia   . Anxiety   . Depression   . Hepatitis C     Past Surgical History:  Procedure Laterality Date  . bone graft Right    arm  . LAPAROSCOPIC CHOLECYSTECTOMY      Family History  Problem Relation Age of Onset  . Depression Mother   . Miscarriages / [place] Mother   . Vision loss Mother   . Varicose Veins Mother   . Drug abuse Father   . Cancer Maternal Aunt   . Arthritis Maternal Grandmother   . Cancer Maternal Grandmother   . Hypertension Maternal Grandmother   . Cancer Maternal Grandfather     Social History   Socioeconomic History  . Marital status: Single    Spouse name: Not on file  . Number of children: Not on file  . Years of education: Not on file  . Highest education level: Not on file  Occupational History  . Not on file  Tobacco Use  . Smoking status: Current Every Day Smoker    Packs/day: 0.25    Types: Cigarettes  . Smokeless tobacco: Never Used  Substance and Sexual Activity  . Alcohol use: No  . Drug use: No  . Sexual activity: Yes  Other Topics Concern  . Not on file  Social History Narrative  . Not on file   Social Determinants of Health   Financial Resource Strain:   . Difficulty of Paying Living Expenses: Not on file  Food Insecurity:   . Worried About Programme researcher, broadcasting/film/video in the Last Year: Not on file  . Ran Out of Food in the Last Year: Not on file  Transportation Needs:   . Lack of Transportation (Medical): Not on file  . Lack of Transportation (Non-Medical): Not on file  Physical Activity:   . Days of Exercise per Week: Not on file  . Minutes of Exercise per Session: Not on file  Stress:   . Feeling of Stress : Not on file  Social Connections:    . Frequency of Communication with Friends and Family: Not on file  . Frequency of Social Gatherings with Friends and Family: Not on file  . Attends Religious Services: Not on file  . Active Member of Clubs or Organizations: Not on file  . Attends Banker Meetings: Not on file  . Marital Status: Not on file  Intimate Partner Violence:   . Fear of Current or Ex-Partner: Not on file  . Emotionally Abused: Not on file  . Physically Abused: Not on file  . Sexually Abused: Not on file    No outpatient medications prior to visit.   No facility-administered medications prior to visit.    Allergies  Allergen Reactions  . Sulfa Antibiotics Nausea And Vomiting and Other (See Comments)    Pain, body aches, headache, fever    ROS Review of Systems    Objective:    Physical Exam  There were no vitals taken for this visit. Wt Readings from Last 3 Encounters:  07/01/20 107 lb 6.4 oz (48.7 kg)  06/28/20 110 lb (49.9 kg)  02/27/20 102 lb (46.3 kg)     Health Maintenance Due  Topic Date  Due  . Hepatitis C Screening  Never done  . PAP-Cervical Cytology Screening  03/27/2020  . PAP SMEAR-Modifier  03/27/2020    There are no preventive care reminders to display for this patient.  No results found for: TSH Lab Results  Component Value Date   WBC 6.4 12/22/2017   HGB 14.7 12/22/2017   HCT 44.0 12/22/2017   MCV 94.2 12/22/2017   PLT 204 12/22/2017   Lab Results  Component Value Date   NA 139 12/22/2017   K 4.1 12/22/2017   CO2 27 12/22/2017   GLUCOSE 94 12/22/2017   BUN <5 (L) 12/22/2017   CREATININE 0.75 12/22/2017   BILITOT 0.7 12/22/2017   ALKPHOS 91 12/22/2017   AST 59 (H) 12/22/2017   ALT 85 (H) 12/22/2017   PROT 7.0 12/22/2017   ALBUMIN 4.0 12/22/2017   CALCIUM 9.0 12/22/2017   ANIONGAP 8 12/22/2017   No results found for: CHOL No results found for: HDL No results found for: LDLCALC No results found for: TRIG No results found for: CHOLHDL No  results found for: HGBA1C    Assessment & Plan:   Problem List Items Addressed This Visit    None      No orders of the defined types were placed in this encounter.   Follow-up: No follow-ups on file.    Tamara Roberts, NP

## 2020-07-08 ENCOUNTER — Encounter: Payer: Medicaid Other | Admitting: Nurse Practitioner

## 2020-07-09 ENCOUNTER — Other Ambulatory Visit: Payer: Self-pay

## 2020-07-09 ENCOUNTER — Ambulatory Visit (INDEPENDENT_AMBULATORY_CARE_PROVIDER_SITE_OTHER): Payer: Medicaid Other | Admitting: Nurse Practitioner

## 2020-07-09 ENCOUNTER — Encounter: Payer: Self-pay | Admitting: Nurse Practitioner

## 2020-07-09 VITALS — BP 125/89 | HR 115 | Temp 98.5°F | Resp 16 | Ht 60.0 in | Wt 109.0 lb

## 2020-07-09 DIAGNOSIS — M25561 Pain in right knee: Secondary | ICD-10-CM

## 2020-07-09 DIAGNOSIS — M25562 Pain in left knee: Secondary | ICD-10-CM

## 2020-07-09 MED ORDER — KETOROLAC TROMETHAMINE 10 MG PO TABS: 10.0000 mg | ORAL_TABLET | Freq: Three times a day (TID) | ORAL | 0 refills | Status: AC | PRN

## 2020-07-09 MED ORDER — KETOROLAC TROMETHAMINE 10 MG PO TABS: 10.0000 mg | ORAL_TABLET | Freq: Three times a day (TID) | ORAL | 0 refills | Status: DC

## 2020-07-09 MED ORDER — PREDNISONE 10 MG PO TABS: ORAL_TABLET | ORAL | 0 refills | Status: AC

## 2020-07-09 MED ORDER — KETOROLAC TROMETHAMINE 10 MG PO TABS: 10.0000 mg | ORAL_TABLET | Freq: Three times a day (TID) | ORAL | Status: DC | PRN

## 2020-07-09 NOTE — Patient Instructions (Signed)
For your knee pain, I prescribed ketorolac and prednisone.  Take these as prescribed. You may take tylenol as well, but avoid motrin, ibuprofen, and aleve.   Tendinitis   Tendinitis is inflammation of a tendon. A tendon is a strong cord of tissue that connects muscle to bone. Tendinitis can affect any tendon, but it most commonly affects the:  Shoulder tendon (rotator cuff).  Ankle tendon (Achilles tendon).  Elbow tendon (triceps tendon).  Tendons in the wrist. What are the causes? This condition may be caused by:  Overusing a tendon or muscle. This is common.  Age-related wear and tear.  Injury.  Inflammatory conditions, such as arthritis.  Certain medicines. What increases the risk? You are more likely to develop this condition if you do activities that involve the same movements over and over again (repetitive motions). What are the signs or symptoms? Symptoms of this condition may include:  Pain.  Tenderness.  Mild swelling.  Decreased range of motion. How is this diagnosed? This condition is diagnosed with a physical exam. You may also have tests, such as:  Ultrasound. This uses sound waves to make an image of the inside of your body in the affected area.  MRI. How is this treated? This condition may be treated by resting, icing, applying pressure (compression), and raising (elevating) the affected area above the level of your heart. This is known as RICE therapy. Treatment may also include:  Medicines to help reduce inflammation or to help reduce pain.  Exercises or physical therapy to strengthen and stretch the tendon.  A brace or splint.  Surgery. This is rarely needed. Follow these instructions at home: If you have a splint or brace:  Wear the splint or brace as told by your health care provider. Remove it only as told by your health care provider.  Loosen the splint or brace if your fingers or toes tingle, become numb, or turn cold and  blue.  Keep the splint or brace clean.  If the splint or brace is not waterproof: ? Do not let it get wet. ? Cover it with a watertight covering when you take a bath or shower. Managing pain, stiffness, and swelling  If directed, put ice on the affected area. ? If you have a removable splint or brace, remove it as told by your health care provider. ? Put ice in a plastic bag. ? Place a towel between your skin and the bag. ? Leave the ice on for 20 minutes, 2-3 times a day.  Move the fingers or toes of the affected limb often, if this applies. This can help to prevent stiffness and lessen swelling.  If directed, raise (elevate) the affected area above the level of your heart while you are sitting or lying down.  If directed, apply heat to the affected area before you exercise. Use the heat source that your health care provider recommends, such as a moist heat pack or a heating pad.      ? Place a towel between your skin and the heat source. ? Leave the heat on for 20-30 minutes. ? Remove the heat if your skin turns bright red. This is especially important if you are unable to feel pain, heat, or cold. You may have a greater risk of getting burned. Driving  Do not drive or use heavy machinery while taking prescription pain medicine.  Ask your health care provider when it is safe to drive if you have a splint or brace on any part  of your arm or leg. Activity  Rest the affected area as told by your health care provider.  Return to your normal activities as told by your health care provider. Ask your health care provider what activities are safe for you.  Avoid using the affected area while you are experiencing symptoms of tendinitis.  Do exercises as told by your health care provider. General instructions  If you have a splint, do not put pressure on any part of the splint until it is fully hardened. This may take several hours.  Wear an elastic bandage or compression  wrap only as told by your health care provider.  Take over-the-counter and prescription medicines only as told by your health care provider.  Keep all follow-up visits as told by your health care provider. This is important. Contact a health care provider if:  Your symptoms do not improve.  You develop new, unexplained problems, such as numbness in your hands. Summary  Tendinitis is inflammation of a tendon.  You are more likely to develop this condition if you do activities that involve the same movements over and over again.  This condition may be treated by resting, icing, applying pressure (compression), and elevating the area above the level of your heart. This is known as RICE therapy.  Avoid using the affected area while you are experiencing symptoms of tendinitis. This information is not intended to replace advice given to you by your health care provider. Make sure you discuss any questions you have with your health care provider. Document Revised: 02/12/2018 Document Reviewed: 12/26/2017 Elsevier Patient Education  2020 [employer].

## 2020-07-09 NOTE — Progress Notes (Signed)
Established Patient Office Visit  Subjective:  Patient ID: Tamara Harrell, female    DOB: March 17, 1996  Age: 24 y.o. MRN: [id number]  CC:  Chief Complaint  Patient presents with   Knee Pain    HPI Tamara Harrell presents for follow-up for bilateral knee pain.  She came in to establish care last week, and she was treated with 5 days of toradol.  Today, she states she was unable to pick up the Rx.  Her knee pain started about 3 weeks ago and she describes it as moderate to severe.  She states she feels a clicking when she bends her knees.  She states she would like to get her rx sent to [facility] on [address]  Today, she reports her pain was not improved She was supposed to come in for an annual physical exam as well fasting lab work that included an autoimmune panel, but she no-showed that appointment and was able to be worked in for acute visit today.  Past Medical History:  Diagnosis Date   Anemia    Anxiety    Depression    Hepatitis C     Past Surgical History:  Procedure Laterality Date   bone graft Right    arm   LAPAROSCOPIC CHOLECYSTECTOMY      Family History  Problem Relation Age of Onset   Depression Mother    Miscarriages / [place] Mother    Vision loss Mother    Varicose Veins Mother    Drug abuse Father    Cancer Maternal Aunt    Arthritis Maternal Grandmother    Cancer Maternal Grandmother    Hypertension Maternal Grandmother    Cancer Maternal Grandfather     Social History   Socioeconomic History   Marital status: Single    Spouse name: Not on file   Number of children: Not on file   Years of education: Not on file   Highest education level: Not on file  Occupational History   Not on file  Tobacco Use   Smoking status: Current Every Day Smoker    Packs/day: 0.25    Types: Cigarettes   Smokeless tobacco: Never Used  Substance and Sexual Activity   Alcohol use: No   Drug use: No   Sexual activity: Yes    Other Topics Concern   Not on file  Social History Narrative   Not on file   Social Determinants of Health   Financial Resource Strain:    Difficulty of Paying Living Expenses: Not on file  Food Insecurity:    Worried About Programme researcher, broadcasting/film/video in the Last Year: Not on file   [employer] of Food in the Last Year: Not on file  Transportation Needs:    Lack of Transportation (Medical): Not on file   Lack of Transportation (Non-Medical): Not on file  Physical Activity:    Days of Exercise per Week: Not on file   Minutes of Exercise per Session: Not on file  Stress:    Feeling of Stress : Not on file  Social Connections:    Frequency of Communication with Friends and Family: Not on file   Frequency of Social Gatherings with Friends and Family: Not on file   Attends Religious Services: Not on file   Active Member of Clubs or Organizations: Not on file   Attends Banker Meetings: Not on file   Marital Status: Not on file  Intimate Partner Violence:    Fear of  Current or Ex-Partner: Not on file   Emotionally Abused: Not on file   Physically Abused: Not on file   Sexually Abused: Not on file    No outpatient medications prior to visit.   No facility-administered medications prior to visit.    Allergies  Allergen Reactions   Sulfa Antibiotics Nausea And Vomiting and Other (See Comments)    Pain, body aches, headache, fever    ROS Review of Systems  Musculoskeletal: Positive for arthralgias.       Knee pain per HPI  Neurological:       Tingling to shoulders has resolved      Objective:    Physical Exam Musculoskeletal:        General: Tenderness present. No swelling, deformity or signs of injury.     Comments: Bilateral knee tenderness; left knee tender on palpation to patellar tendon     BP 125/89    Pulse (!) 115    Temp 98.5 F (36.9 C)    Resp 16    Ht 5' (1.524 m)    Wt 109 lb (49.4 kg)    SpO2 96%    BMI 21.29 kg/m  Wt  Readings from Last 3 Encounters:  07/09/20 109 lb (49.4 kg)  07/01/20 107 lb 6.4 oz (48.7 kg)  06/28/20 110 lb (49.9 kg)     Health Maintenance Due  Topic Date Due   Hepatitis C Screening  Never done   PAP-Cervical Cytology Screening  03/27/2020   PAP SMEAR-Modifier  03/27/2020    There are no preventive care reminders to display for this patient.  No results found for: TSH Lab Results  Component Value Date   WBC 6.4 12/22/2017   HGB 14.7 12/22/2017   HCT 44.0 12/22/2017   MCV 94.2 12/22/2017   PLT 204 12/22/2017   Lab Results  Component Value Date   NA 139 12/22/2017   K 4.1 12/22/2017   CO2 27 12/22/2017   GLUCOSE 94 12/22/2017   BUN <5 (L) 12/22/2017   CREATININE 0.75 12/22/2017   BILITOT 0.7 12/22/2017   ALKPHOS 91 12/22/2017   AST 59 (H) 12/22/2017   ALT 85 (H) 12/22/2017   PROT 7.0 12/22/2017   ALBUMIN 4.0 12/22/2017   CALCIUM 9.0 12/22/2017   ANIONGAP 8 12/22/2017   No results found for: CHOL No results found for: HDL No results found for: LDLCALC No results found for: TRIG No results found for: CHOLHDL No results found for: DXAJ2I    Assessment & Plan:   Problem List Items Addressed This Visit    None    Visit Diagnoses    Acute pain of both knees    -  Primary   Relevant Medications   ketorolac (TORADOL) tablet 10 mg     Bilateral Knee pain -started 3 weeks ago, unsure of etiology -?tendonitis -Rx. toradol and prednisone  Meds ordered this encounter  Medications   ketorolac (TORADOL) tablet 10 mg   predniSONE (DELTASONE) 10 MG tablet    Sig: Take 6 tablets (60 mg total) by mouth daily with breakfast for 1 day, THEN 5 tablets (50 mg total) daily with breakfast for 1 day, THEN 4 tablets (40 mg total) daily with breakfast for 1 day, THEN 3 tablets (30 mg total) daily with breakfast for 1 day, THEN 2 tablets (20 mg total) daily with breakfast for 1 day, THEN 1 tablet (10 mg total) daily with breakfast for 1 day.    Dispense:  21 tablet  Refill:  0    Follow-up: Return in about 1 week (around 07/16/2020) for Physical exam and to review labs.    [name], NP

## 2020-07-09 NOTE — Addendum Note (Signed)
Addended by: Bjorn Pippin on: 07/09/2020 09:41 AM   Modules accepted: Orders

## 2020-07-10 LAB — CBC WITH DIFFERENTIAL/PLATELET
Hemoglobin: 16.1 g/dL — ABNORMAL HIGH (ref 11.1–15.9)
Immature Grans (Abs): 0 10*3/uL (ref 0.0–0.1)
MCH: 31.4 pg (ref 26.6–33.0)

## 2020-07-10 LAB — CMP14+EGFR
Albumin/Globulin Ratio: 1.9 (ref 1.2–2.2)
BUN/Creatinine Ratio: 11 (ref 9–23)
Chloride: 98 mmol/L (ref 96–106)

## 2020-07-11 LAB — CMP14+EGFR
ALT: 58 IU/L — ABNORMAL HIGH (ref 0–32)
AST: 39 IU/L (ref 0–40)
Albumin: 5.4 g/dL — ABNORMAL HIGH (ref 3.9–5.0)
Alkaline Phosphatase: 100 IU/L (ref 44–121)
BUN: 9 mg/dL (ref 6–20)
Bilirubin Total: <0.2 mg/dL (ref 0.0–1.2)
CO2: 21 mmol/L (ref 20–29)
Calcium: 10 mg/dL (ref 8.7–10.2)
Creatinine, Ser: 0.85 mg/dL (ref 0.57–1.00)
GFR calc Af Amer: 109 mL/min/{1.73_m2} (ref >59)
GFR calc non Af Amer: 95 mL/min/{1.73_m2} (ref >59)
Globulin, Total: 2.9 g/dL (ref 1.5–4.5)
Glucose: 60 mg/dL — ABNORMAL LOW (ref 65–99)
Potassium: 5 mmol/L (ref 3.5–5.2)
Sodium: 136 mmol/L (ref 134–144)
Total Protein: 8.3 g/dL (ref 6.0–8.5)

## 2020-07-11 LAB — CBC WITH DIFFERENTIAL/PLATELET
Basophils Absolute: 0.1 10*3/uL (ref 0.0–0.2)
Basos: 1 %
EOS (ABSOLUTE): 0.5 10*3/uL — ABNORMAL HIGH (ref 0.0–0.4)
Eos: 5 %
Hematocrit: 47.7 % — ABNORMAL HIGH (ref 34.0–46.6)
Immature Granulocytes: 0 %
Lymphocytes Absolute: 3.9 10*3/uL — ABNORMAL HIGH (ref 0.7–3.1)
Lymphs: 40 %
MCHC: 33.8 g/dL (ref 31.5–35.7)
MCV: 93 fL (ref 79–97)
Monocytes Absolute: 0.8 10*3/uL (ref 0.1–0.9)
Monocytes: 8 %
Neutrophils Absolute: 4.5 10*3/uL (ref 1.4–7.0)
Neutrophils: 46 %
Platelets: 282 10*3/uL (ref 150–450)
RBC: 5.13 x10E6/uL (ref 3.77–5.28)
RDW: 12.6 % (ref 11.7–15.4)
WBC: 9.8 10*3/uL (ref 3.4–10.8)

## 2020-07-11 LAB — AUTOIMMUNE PROFILE
Anti Nuclear Antibody (ANA): POSITIVE — AB
Complement C3, Serum: 149 mg/dL (ref 82–167)
dsDNA Ab: 3 IU/mL (ref 0–9)

## 2020-07-11 LAB — LIPID PANEL WITH LDL/HDL RATIO
Cholesterol, Total: 269 mg/dL — ABNORMAL HIGH (ref 100–199)
HDL: 63 mg/dL (ref >39)
LDL Chol Calc (NIH): 163 mg/dL — ABNORMAL HIGH (ref 0–99)
LDL/HDL Ratio: 2.6 ratio (ref 0.0–3.2)
Triglycerides: 233 mg/dL — ABNORMAL HIGH (ref 0–149)
VLDL Cholesterol Cal: 43 mg/dL — ABNORMAL HIGH (ref 5–40)

## 2020-07-12 NOTE — Addendum Note (Signed)
Addended by: Bjorn Pippin on: 07/12/2020 07:52 AM   Modules accepted: Orders

## 2020-07-19 ENCOUNTER — Encounter: Payer: Self-pay | Admitting: Nurse Practitioner

## 2020-07-19 ENCOUNTER — Ambulatory Visit: Payer: Medicaid Other | Admitting: Nurse Practitioner

## 2020-07-19 DIAGNOSIS — M25561 Pain in right knee: Secondary | ICD-10-CM | POA: Insufficient documentation

## 2020-07-19 NOTE — Progress Notes (Deleted)
Acute Office Visit  Subjective:    Patient ID: Tamara Harrell, female    DOB: 1996-02-25, 24 y.o.   MRN: [id number]  No chief complaint on file.   HPI Patient is in today for follow-up for her knee pain.  She was seen in the office about a week ago for ongoing bilateral knee pain.  She was treated with prednisone and ketorolac. ??? Any improvement ???  She had labs drawn that showed positive ANA.   Past Medical History:  Diagnosis Date  . Anemia   . Anxiety   . Depression   . Hepatitis C     Past Surgical History:  Procedure Laterality Date  . bone graft Right    arm  . LAPAROSCOPIC CHOLECYSTECTOMY      Family History  Problem Relation Age of Onset  . Depression Mother   . Miscarriages / [place] Mother   . Vision loss Mother   . Varicose Veins Mother   . Drug abuse Father   . Cancer Maternal Aunt   . Arthritis Maternal Grandmother   . Cancer Maternal Grandmother   . Hypertension Maternal Grandmother   . Cancer Maternal Grandfather     Social History   Socioeconomic History  . Marital status: Single    Spouse name: Not on file  . Number of children: Not on file  . Years of education: Not on file  . Highest education level: Not on file  Occupational History  . Not on file  Tobacco Use  . Smoking status: Current Every Day Smoker    Packs/day: 0.25    Types: Cigarettes  . Smokeless tobacco: Never Used  Substance and Sexual Activity  . Alcohol use: No  . Drug use: No  . Sexual activity: Yes  Other Topics Concern  . Not on file  Social History Narrative  . Not on file   Social Determinants of Health   Financial Resource Strain:   . Difficulty of Paying Living Expenses: Not on file  Food Insecurity:   . Worried About Programme researcher, broadcasting/film/video in the Last Year: Not on file  . Ran Out of Food in the Last Year: Not on file  Transportation Needs:   . Lack of Transportation (Medical): Not on file  . Lack of Transportation (Non-Medical): Not on file   Physical Activity:   . Days of Exercise per Week: Not on file  . Minutes of Exercise per Session: Not on file  Stress:   . Feeling of Stress : Not on file  Social Connections:   . Frequency of Communication with Friends and Family: Not on file  . Frequency of Social Gatherings with Friends and Family: Not on file  . Attends Religious Services: Not on file  . Active Member of Clubs or Organizations: Not on file  . Attends Banker Meetings: Not on file  . Marital Status: Not on file  Intimate Partner Violence:   . Fear of Current or Ex-Partner: Not on file  . Emotionally Abused: Not on file  . Physically Abused: Not on file  . Sexually Abused: Not on file    No outpatient medications prior to visit.   No facility-administered medications prior to visit.    Allergies  Allergen Reactions  . Sulfa Antibiotics Nausea And Vomiting and Other (See Comments)    Pain, body aches, headache, fever    Review of Systems     Objective:    Physical Exam  There were no vitals  taken for this visit. Wt Readings from Last 3 Encounters:  07/09/20 109 lb (49.4 kg)  07/01/20 107 lb 6.4 oz (48.7 kg)  06/28/20 110 lb (49.9 kg)    Health Maintenance Due  Topic Date Due  . Hepatitis C Screening  Never done  . COVID-19 Vaccine (1) Never done  . PAP-Cervical Cytology Screening  03/27/2020  . PAP SMEAR-Modifier  03/27/2020    There are no preventive care reminders to display for this patient.   No results found for: TSH Lab Results  Component Value Date   WBC 9.8 07/09/2020   HGB 16.1 (H) 07/09/2020   HCT 47.7 (H) 07/09/2020   MCV 93 07/09/2020   PLT 282 07/09/2020   Lab Results  Component Value Date   NA 136 07/09/2020   K 5.0 07/09/2020   CO2 21 07/09/2020   GLUCOSE 60 (L) 07/09/2020   BUN 9 07/09/2020   CREATININE 0.85 07/09/2020   BILITOT <0.2 07/09/2020   ALKPHOS 100 07/09/2020   AST 39 07/09/2020   ALT 58 (H) 07/09/2020   PROT 8.3 07/09/2020    ALBUMIN 5.4 (H) 07/09/2020   CALCIUM 10.0 07/09/2020   ANIONGAP 8 12/22/2017   Lab Results  Component Value Date   CHOL 269 (H) 07/09/2020   Lab Results  Component Value Date   HDL 63 07/09/2020   Lab Results  Component Value Date   LDLCALC 163 (H) 07/09/2020   Lab Results  Component Value Date   TRIG 233 (H) 07/09/2020   No results found for: CHOLHDL No results found for: OZDG6Y     Assessment & Plan:   Problem List Items Addressed This Visit    None    Visit Diagnoses    Acute pain of both knees    -  Primary       No orders of the defined types were placed in this encounter.    [name], NP

## 2020-07-20 ENCOUNTER — Ambulatory Visit (HOSPITAL_COMMUNITY)
Admission: RE | Admit: 2020-07-20 | Discharge: 2020-07-20 | Disposition: A | Discharge status: Home / Self Care | Payer: Medicaid Other | Source: Ambulatory Visit | Attending: Nurse Practitioner | Admitting: Nurse Practitioner

## 2020-07-20 ENCOUNTER — Other Ambulatory Visit: Payer: Self-pay

## 2020-07-20 ENCOUNTER — Encounter: Payer: Self-pay | Admitting: Nurse Practitioner

## 2020-07-20 ENCOUNTER — Telehealth: Payer: Self-pay | Admitting: Nurse Practitioner

## 2020-07-20 ENCOUNTER — Ambulatory Visit (INDEPENDENT_AMBULATORY_CARE_PROVIDER_SITE_OTHER): Payer: Medicaid Other | Admitting: Nurse Practitioner

## 2020-07-20 VITALS — BP 126/89 | HR 110 | Temp 98.6°F | Resp 20 | Ht 60.0 in | Wt 106.0 lb

## 2020-07-20 DIAGNOSIS — M25561 Pain in right knee: Secondary | ICD-10-CM

## 2020-07-20 DIAGNOSIS — M25562 Pain in left knee: Secondary | ICD-10-CM | POA: Diagnosis not present

## 2020-07-20 DIAGNOSIS — E785 Hyperlipidemia, unspecified: Secondary | ICD-10-CM | POA: Diagnosis not present

## 2020-07-20 DIAGNOSIS — Z0000 Encounter for general adult medical examination without abnormal findings: Secondary | ICD-10-CM

## 2020-07-20 MED ORDER — MELOXICAM 7.5 MG PO TABS: 7.5000 mg | ORAL_TABLET | Freq: Every day | ORAL | 0 refills | Status: DC

## 2020-07-20 NOTE — Assessment & Plan Note (Addendum)
-  ANA was positive -refer to rheumatology -will get imaging of her knees -toradol helped, but she took this for 5 days -Rx. meloxicam

## 2020-07-20 NOTE — Patient Instructions (Signed)
Preventing High Cholesterol Cholesterol is a white, waxy substance similar to fat that the human body needs to help build cells. The liver makes all the cholesterol that a person's body needs. Having high cholesterol (hypercholesterolemia) increases a person's risk for heart disease and stroke. Extra (excess) cholesterol comes from the food the person eats. High cholesterol can often be prevented with diet and lifestyle changes. If you already have high cholesterol, you can control it with diet and lifestyle changes and with medicine. How can high cholesterol affect me? If you have high cholesterol, deposits (plaques) may build up on the walls of your arteries. The arteries are the blood vessels that carry blood away from your heart. Plaques make the arteries narrower and stiffer. This can limit or block blood flow and cause blood clots to form. Blood clots:  Are tiny balls of cells that form in your blood.  Can move to the heart or brain, causing a heart attack or stroke. Plaques in arteries greatly increase your risk for heart attack and stroke.Making diet and lifestyle changes can reduce your risk for these conditions that may threaten your life. What can increase my risk? This condition is more likely to develop in people who:  Eat foods that are high in saturated fat or cholesterol. Saturated fat is mostly found in: ? Foods that contain animal fat, such as red meat and some dairy products. ? Certain fatty foods made from plants, such as tropical oils.  Are overweight.  Are not getting enough exercise.  Have a family history of high cholesterol. What actions can I take to prevent this? Nutrition   Eat less saturated fat.  Avoid trans fats (partially hydrogenated oils). These are often found in margarine and in some baked goods, fried foods, and snacks bought in packages.  Avoid precooked or cured meat, such as sausages or meat loaves.  Avoid foods and drinks that have added  sugars.  Eat more fruits, vegetables, and whole grains.  Choose healthy sources of protein, such as fish, poultry, lean cuts of red meat, beans, peas, lentils, and nuts.  Choose healthy sources of fat, such as: ? Nuts. ? Vegetable oils, especially olive oil. ? Fish that have healthy fats (omega-3 fatty acids), such as mackerel or salmon. The items listed above may not be a complete list of recommended foods and beverages. Contact a dietitian for more information. Lifestyle  Lose weight if you are overweight. Losing 5-10 lb (2.3-4.5 kg) can help prevent or control high cholesterol. It can also lower your risk for diabetes and high blood pressure. Ask your health care provider to help you with a diet and exercise plan to lose weight safely.  Do not use any products that contain nicotine or tobacco, such as cigarettes, e-cigarettes, and chewing tobacco. If you need help quitting, ask your health care provider.  Limit your alcohol intake. ? Do not drink alcohol if:  Your health care provider tells you not to drink.  You are pregnant, may be pregnant, or are planning to become pregnant. ? If you drink alcohol:  Limit how much you use to:  0-1 drink a day for women.  0-2 drinks a day for men.  Be aware of how much alcohol is in your drink. In the [place] one drink equals one 12 oz bottle of beer (355 mL), one 5 oz glass of wine (148 mL), or one 1 oz glass of hard liquor (44 mL). Activity   Get enough exercise. Each week, do at   least 150 minutes of exercise that takes a medium level of effort (moderate-intensity exercise). ? This is exercise that:  Makes your heart beat faster and makes you breathe harder than usual.  Allows you to still be able to talk. ? You could exercise in short sessions several times a day or longer sessions a few times a week. For example, on 5 days each week, you could walk fast or ride your bike 3 times a day for 10 minutes each time.  Do exercises as told  by your health care provider. Medicines  In addition to diet and lifestyle changes, your health care provider may recommend medicines to help lower cholesterol. This may be a medicine to lower the amount of cholesterol your liver makes. You may need medicine if: ? Diet and lifestyle changes do not lower your cholesterol enough. ? You have high cholesterol and other risk factors for heart disease or stroke.  Take over-the-counter and prescription medicines only as told by your health care provider. General information  Manage your risk factors for high cholesterol. Talk with your health care provider about all your risk factors and how to lower your risk.  Manage other conditions that you have, such as diabetes or high blood pressure (hypertension).  Have blood tests to check your cholesterol levels at regular points in time as told by your health care provider.  Keep all follow-up visits as told by your health care provider. This is important. Where to find more information  American Heart Association: www.heart.org  National Heart, Lung, and Blood Institute: www.nhlbi.nih.gov Summary  High cholesterol increases your risk for heart disease and stroke. By keeping your cholesterol level low, you can reduce your risk for these conditions.  High cholesterol can often be prevented with diet and lifestyle changes.  Work with your health care provider to manage your risk factors, and have your blood tested regularly. This information is not intended to replace advice given to you by your health care provider. Make sure you discuss any questions you have with your health care provider. Document Revised: 11/29/2018 Document Reviewed: 04/15/2016 Elsevier Patient Education  2020 [employer]  

## 2020-07-20 NOTE — Progress Notes (Addendum)
Established Patient Office Visit  Subjective:  Patient ID: Tamara Harrell, female    DOB: Apr 02, 1996  Age: 24 y.o. MRN: [id number]  CC:  Chief Complaint  Patient presents with  . Knee Pain    HPI ANJENETTE GERBINO presents for follow-up for bilateral knee pain.  She came in to establish care last week, and she was treated with 5 days of toradol.  Today, she states she was unable to pick up the Rx.  Her knee pain started about 3 weeks ago and she describes it as moderate to severe.  She states she feels a clicking when she bends her knees.  She states she would like to get her rx sent to [facility] on [address]  Today, she reports her pain was not improved She was supposed to come in for an annual physical exam as well fasting lab work that included an autoimmune panel, but she no-showed that appointment and was able to be worked in for acute visit today.  Past Medical History:  Diagnosis Date  . Anemia   . Anxiety   . Depression   . Hepatitis C     Past Surgical History:  Procedure Laterality Date  . bone graft Right    arm  . LAPAROSCOPIC CHOLECYSTECTOMY      Family History  Problem Relation Age of Onset  . Depression Mother   . Miscarriages / [place] Mother   . Vision loss Mother   . Varicose Veins Mother   . Drug abuse Father   . Cancer Maternal Aunt   . Arthritis Maternal Grandmother   . Cancer Maternal Grandmother   . Hypertension Maternal Grandmother   . Cancer Maternal Grandfather     Social History   Socioeconomic History  . Marital status: Single    Spouse name: Not on file  . Number of children: Not on file  . Years of education: Not on file  . Highest education level: Not on file  Occupational History  . Not on file  Tobacco Use  . Smoking status: Current Every Day Smoker    Packs/day: 0.25    Types: Cigarettes  . Smokeless tobacco: Never Used  Substance and Sexual Activity  . Alcohol use: No  . Drug use: No  . Sexual activity: Yes    Other Topics Concern  . Not on file  Social History Narrative  . Not on file   Social Determinants of Health   Financial Resource Strain:   . Difficulty of Paying Living Expenses: Not on file  Food Insecurity:   . Worried About Programme researcher, broadcasting/film/video in the Last Year: Not on file  . Ran Out of Food in the Last Year: Not on file  Transportation Needs:   . Lack of Transportation (Medical): Not on file  . Lack of Transportation (Non-Medical): Not on file  Physical Activity:   . Days of Exercise per Week: Not on file  . Minutes of Exercise per Session: Not on file  Stress:   . Feeling of Stress : Not on file  Social Connections:   . Frequency of Communication with Friends and Family: Not on file  . Frequency of Social Gatherings with Friends and Family: Not on file  . Attends Religious Services: Not on file  . Active Member of Clubs or Organizations: Not on file  . Attends Banker Meetings: Not on file  . Marital Status: Not on file  Intimate Partner Violence:   . Fear of  Current or Ex-Partner: Not on file  . Emotionally Abused: Not on file  . Physically Abused: Not on file  . Sexually Abused: Not on file    No outpatient medications prior to visit.   No facility-administered medications prior to visit.    Allergies  Allergen Reactions  . Sulfa Antibiotics Nausea And Vomiting and Other (See Comments)    Pain, body aches, headache, fever    ROS Review of Systems  Musculoskeletal: Positive for arthralgias.       Knee pain per HPI  Neurological:       Tingling to shoulders has resolved      Objective:    Physical Exam Musculoskeletal:        General: Tenderness present. No swelling, deformity or signs of injury.     Comments: Bilateral knee tenderness; left knee tender on palpation to patellar tendon     BP 125/89   Pulse (!) 115   Temp 98.5 F (36.9 C)   Resp 16   Ht 5' (1.524 m)   Wt 109 lb (49.4 kg)   SpO2 96%   BMI 21.29 kg/m  Wt  Readings from Last 3 Encounters:  07/09/20 109 lb (49.4 kg)  07/01/20 107 lb 6.4 oz (48.7 kg)  06/28/20 110 lb (49.9 kg)     Health Maintenance Due  Topic Date Due  . Hepatitis C Screening  Never done  . PAP-Cervical Cytology Screening  03/27/2020  . PAP SMEAR-Modifier  03/27/2020    There are no preventive care reminders to display for this patient.  No results found for: TSH Lab Results  Component Value Date   WBC 6.4 12/22/2017   HGB 14.7 12/22/2017   HCT 44.0 12/22/2017   MCV 94.2 12/22/2017   PLT 204 12/22/2017   Lab Results  Component Value Date   NA 139 12/22/2017   K 4.1 12/22/2017   CO2 27 12/22/2017   GLUCOSE 94 12/22/2017   BUN <5 (L) 12/22/2017   CREATININE 0.75 12/22/2017   BILITOT 0.7 12/22/2017   ALKPHOS 91 12/22/2017   AST 59 (H) 12/22/2017   ALT 85 (H) 12/22/2017   PROT 7.0 12/22/2017   ALBUMIN 4.0 12/22/2017   CALCIUM 9.0 12/22/2017   ANIONGAP 8 12/22/2017   No results found for: CHOL No results found for: HDL No results found for: LDLCALC No results found for: TRIG No results found for: CHOLHDL No results found for: [id number]    Assessment & Plan:   Problem List Items Addressed This Visit    None    Visit Diagnoses    Acute pain of both knees    -  Primary   Relevant Medications   ketorolac (TORADOL) tablet 10 mg     Bilateral Knee pain -started 3 weeks ago, unsure of etiology -?tendonitis -Rx. toradol and prednisone  Meds ordered this encounter  Medications  . ketorolac (TORADOL) tablet 10 mg  . predniSONE (DELTASONE) 10 MG tablet    Sig: Take 6 tablets (60 mg total) by mouth daily with breakfast for 1 day, THEN 5 tablets (50 mg total) daily with breakfast for 1 day, THEN 4 tablets (40 mg total) daily with breakfast for 1 day, THEN 3 tablets (30 mg total) daily with breakfast for 1 day, THEN 2 tablets (20 mg total) daily with breakfast for 1 day, THEN 1 tablet (10 mg total) daily with breakfast for 1 day.    Dispense:  21 tablet      Refill:  0  Follow-up: Return in about 1 week (around 07/16/2020) for Physical exam and to review labs.    [name], NP  Acute Office Visit  Subjective:    Patient ID: Tamara Harrell, female    DOB: October 21, 1995, 24 y.o.   MRN: [id number]  Chief Complaint  Patient presents with  . Knee Pain  . Excessive Sweating    hands and feet     HPI   Patient is in today for bilateral knee pain.  She was seen in office about a week ago and was treated with prednisone and toradol, and these helped.  When the prescriptions ran out, her pain returned.  Her ANA levels were elevated.  Past Medical History:  Diagnosis Date  . Abdominal pain 03/12/2012  . Anemia   . Anxiety   . Cholecystitis, acute 03/12/2012  . Depression   . Encounter for supervision of normal pregnancy in second trimester 03/27/2017    Clinic  [facility] Prenatal Labs Dating  LMP Blood type:   O pos Genetic Screen 1 Screen:    AFP:     Quad:     NIPS: Antibody: neg Anatomic [place]  neg at [facility] MFM u/s Rubella:  imm GTT Early:               Third trimester:  RPR:   neg Flu vaccine  HBsAg:   neg TDaP vaccine                                               Rhogam: n/a HIV:   neg Baby Food     Breastfeed                                        . Hepatitis C   . Insufficient prenatal care 05/24/2017  . Late prenatal care affecting pregnancy 03/27/2017   @ 23wks    Past Surgical History:  Procedure Laterality Date  . bone graft Right    arm  . LAPAROSCOPIC CHOLECYSTECTOMY      Family History  Problem Relation Age of Onset  . Depression Mother   . Miscarriages / [place] Mother   . Vision loss Mother   . Varicose Veins Mother   . Drug abuse Father   . Cancer Maternal Aunt   . Arthritis Maternal Grandmother   . Cancer Maternal Grandmother   . Hypertension Maternal Grandmother   . Cancer Maternal Grandfather     Social History   Socioeconomic History  . Marital status: Single    Spouse name: Not on file  . Number of  children: Not on file  . Years of education: Not on file  . Highest education level: Not on file  Occupational History  . Not on file  Tobacco Use  . Smoking status: Current Every Day Smoker    Packs/day: 0.25    Types: Cigarettes  . Smokeless tobacco: Never Used  Substance and Sexual Activity  . Alcohol use: No  . Drug use: No  . Sexual activity: Yes  Other Topics Concern  . Not on file  Social History Narrative  . Not on file   Social Determinants of Health   Financial Resource Strain:   . Difficulty of Paying Living Expenses: Not on file  Food  Insecurity:   . Worried About Programme researcher, broadcasting/film/video in the Last Year: Not on file  . Ran Out of Food in the Last Year: Not on file  Transportation Needs:   . Lack of Transportation (Medical): Not on file  . Lack of Transportation (Non-Medical): Not on file  Physical Activity:   . Days of Exercise per Week: Not on file  . Minutes of Exercise per Session: Not on file  Stress:   . Feeling of Stress : Not on file  Social Connections:   . Frequency of Communication with Friends and Family: Not on file  . Frequency of Social Gatherings with Friends and Family: Not on file  . Attends Religious Services: Not on file  . Active Member of Clubs or Organizations: Not on file  . Attends Banker Meetings: Not on file  . Marital Status: Not on file  Intimate Partner Violence:   . Fear of Current or Ex-Partner: Not on file  . Emotionally Abused: Not on file  . Physically Abused: Not on file  . Sexually Abused: Not on file    No outpatient medications prior to visit.   No facility-administered medications prior to visit.    Allergies  Allergen Reactions  . Sulfa Antibiotics Nausea And Vomiting and Other (See Comments)    Pain, body aches, headache, fever    Review of Systems  Constitutional: Negative.   Respiratory: Negative.   Cardiovascular: Negative.   Musculoskeletal: Positive for arthralgias.       Bilateral  knee pain  Neurological: Negative.        Objective:    Physical Exam Constitutional:      Appearance: Normal appearance.  Cardiovascular:     Rate and Rhythm: Normal rate and regular rhythm.     Pulses: Normal pulses.     Heart sounds: Normal heart sounds.  Pulmonary:     Effort: Pulmonary effort is normal.     Breath sounds: Normal breath sounds.  Musculoskeletal:        General: Tenderness present.     Comments: Bilateral knee tenderness to palpation  Neurological:     Mental Status: She is alert.  Psychiatric:        Mood and Affect: Mood normal.        Behavior: Behavior normal.     BP 126/89   Pulse (!) 110   Temp 98.6 F (37 C)   Resp 20   Ht 5' (1.524 m)   Wt 106 lb (48.1 kg)   SpO2 97%   BMI 20.70 kg/m  Wt Readings from Last 3 Encounters:  07/20/20 106 lb (48.1 kg)  07/09/20 109 lb (49.4 kg)  07/01/20 107 lb 6.4 oz (48.7 kg)    Health Maintenance Due  Topic Date Due  . Hepatitis C Screening  Never done  . COVID-19 Vaccine (1) Never done  . PAP-Cervical Cytology Screening  03/27/2020  . PAP SMEAR-Modifier  03/27/2020    There are no preventive care reminders to display for this patient.   No results found for: TSH Lab Results  Component Value Date   WBC 9.8 07/09/2020   HGB 16.1 (H) 07/09/2020   HCT 47.7 (H) 07/09/2020   MCV 93 07/09/2020   PLT 282 07/09/2020   Lab Results  Component Value Date   NA 136 07/09/2020   K 5.0 07/09/2020   CO2 21 07/09/2020   GLUCOSE 60 (L) 07/09/2020   BUN 9 07/09/2020   CREATININE 0.85 07/09/2020  BILITOT <0.2 07/09/2020   ALKPHOS 100 07/09/2020   AST 39 07/09/2020   ALT 58 (H) 07/09/2020   PROT 8.3 07/09/2020   ALBUMIN 5.4 (H) 07/09/2020   CALCIUM 10.0 07/09/2020   ANIONGAP 8 12/22/2017   Lab Results  Component Value Date   CHOL 269 (H) 07/09/2020   Lab Results  Component Value Date   HDL 63 07/09/2020   Lab Results  Component Value Date   LDLCALC 163 (H) 07/09/2020   Lab Results    Component Value Date   TRIG 233 (H) 07/09/2020   No results found for: CHOLHDL No results found for: ZOXW9U     Assessment & Plan:   Problem List Items Addressed This Visit      Other   Acute pain of both knees    -ANA was positive -refer to rheumatology -will get imaging of her knees -toradol helped, but she took this for 5 days -Rx. meloxicam      Relevant Orders   Ambulatory referral to Rheumatology   DG Knee Complete 4 Views Left   DG Knee Complete 4 Views Right   Hyperlipidemia    -LDL 163 -discussed cutting back on fried/fatty foods -she would like to try lifestyle changes instead of starting medications -will consider statin therapy if no improvement on labs          Meds ordered this encounter  Medications  . meloxicam (MOBIC) 7.5 MG tablet    Sig: Take 1 tablet (7.5 mg total) by mouth daily.    Dispense:  30 tablet    Refill:  0     [name], NP

## 2020-07-20 NOTE — Telephone Encounter (Signed)
Patient called stated her meds were supposed to go to [facility] on [address] but they were sent to [facility] in [place] can we resend them please p# [phone]

## 2020-07-20 NOTE — Assessment & Plan Note (Signed)
-  LDL 163 -discussed cutting back on fried/fatty foods -she would like to try lifestyle changes instead of starting medications -will consider statin therapy if no improvement on labs

## 2020-07-20 NOTE — Telephone Encounter (Signed)
Rx resent to correct pharmacy, left message informing pt.

## 2020-07-26 ENCOUNTER — Other Ambulatory Visit: Payer: Self-pay | Admitting: Nurse Practitioner

## 2020-07-26 ENCOUNTER — Telehealth: Payer: Self-pay | Admitting: Nurse Practitioner

## 2020-07-26 MED ORDER — IBUPROFEN 600 MG PO TABS: 600.0000 mg | ORAL_TABLET | Freq: Three times a day (TID) | ORAL | 0 refills | Status: DC | PRN

## 2020-07-26 NOTE — Telephone Encounter (Signed)
STOP meloxicam. I sent in ibuprofen. She will have to take this TID instead of daily, but hopefully she won't have side effects with this.

## 2020-07-26 NOTE — Telephone Encounter (Signed)
Please advise, and I will call.

## 2020-07-26 NOTE — Telephone Encounter (Signed)
Patient is requesting to speak with someone in regards to a reaction she is having to the meloxicam she states she cant concentrate and is more tired than normal almost like a brain fog ph# [phone]

## 2020-07-26 NOTE — Telephone Encounter (Signed)
Pt informed

## 2020-07-29 ENCOUNTER — Encounter: Payer: Self-pay | Admitting: Nurse Practitioner

## 2020-07-29 ENCOUNTER — Other Ambulatory Visit: Payer: Self-pay

## 2020-07-29 ENCOUNTER — Ambulatory Visit (INDEPENDENT_AMBULATORY_CARE_PROVIDER_SITE_OTHER): Payer: Medicaid Other | Admitting: Nurse Practitioner

## 2020-07-29 DIAGNOSIS — N644 Mastodynia: Secondary | ICD-10-CM

## 2020-07-29 MED ORDER — AMOXICILLIN-POT CLAVULANATE 875-125 MG PO TABS: 1.0000 | ORAL_TABLET | Freq: Two times a day (BID) | ORAL | 0 refills | Status: DC

## 2020-07-29 NOTE — Assessment & Plan Note (Addendum)
-  bilateral breast pain x3 weeks -palpable masses in both breasts on lateral aspect that is 2-3 inches long that extends under the nipple -unsure of etiology -Rx. augmentin for ?mastitis -she is already taking NSAIDs for pain -since she has right breast discharge, will check prolactin level -ordered u/s bilateral breasts -her last set of labs had elevated ANA, if no improvement in pain, may consider a steroid

## 2020-07-29 NOTE — Addendum Note (Signed)
Addended by: Dellia Cloud on: 07/29/2020 02:28 PM   Modules accepted: Orders

## 2020-07-29 NOTE — Progress Notes (Addendum)
Acute Office Visit  Subjective:    Patient ID: Tamara Harrell, female    DOB: [birth year], 24 y.o.   MRN: [id number]  Chief Complaint  Patient presents with  . Breast Pain    R and L side. L side lump    HPI Patient is in today for bilateral breast pain and left breast mass. She states that the pain started about 3 weeks ago.  She had her LMP 2 weeks ago, and the pain has not resolved.  Her pain is worse when she is moving and when she is breathing.  She has a painful mass in her left breast.  She states that she is usually able to express some milky discharge when she presses on her breast, and there are no changes.  She is not currently breast feeding, and her youngest child is 3.  SH ehas not breast feed in over a year.  He has been taking ibuprofen and naproxen, and that helps some.   Past Medical History:  Diagnosis Date  . Abdominal pain 03/12/2012  . Anemia   . Anxiety   . Cholecystitis, acute 03/12/2012  . Depression   . Encounter for supervision of normal pregnancy in second trimester 03/27/2017    Clinic  [facility] Prenatal Labs Dating  LMP Blood type:   O pos Genetic Screen 1 Screen:    AFP:     Quad:     NIPS: Antibody: neg Anatomic [place]  neg at [facility] MFM u/s Rubella:  imm GTT Early:               Third trimester:  RPR:   neg Flu vaccine  HBsAg:   neg TDaP vaccine                                               Rhogam: n/a HIV:   neg Baby Food     Breastfeed                                        . Hepatitis C   . Insufficient prenatal care 05/24/2017  . Late prenatal care affecting pregnancy 03/27/2017   @ 23wks    Past Surgical History:  Procedure Laterality Date  . bone graft Right    arm  . LAPAROSCOPIC CHOLECYSTECTOMY      Family History  Problem Relation Age of Onset  . Depression Mother   . Miscarriages / [place] Mother   . Vision loss Mother   . Varicose Veins Mother   . Drug abuse Father   . Cancer Maternal Aunt   . Arthritis Maternal Grandmother   .  Cancer Maternal Grandmother   . Hypertension Maternal Grandmother   . Cancer Maternal Grandfather     Social History   Socioeconomic History  . Marital status: Single    Spouse name: Not on file  . Number of children: Not on file  . Years of education: Not on file  . Highest education level: Not on file  Occupational History  . Not on file  Tobacco Use  . Smoking status: Current Every Day Smoker    Packs/day: 0.25    Types: Cigarettes  . Smokeless tobacco: Never Used  Substance and Sexual Activity  . Alcohol use: No  .  Drug use: No  . Sexual activity: Yes  Other Topics Concern  . Not on file  Social History Narrative  . Not on file   Social Determinants of Health   Financial Resource Strain: Not on file  Food Insecurity: Not on file  Transportation Needs: Not on file  Physical Activity: Not on file  Stress: Not on file  Social Connections: Not on file  Intimate Partner Violence: Not on file    Outpatient Medications Prior to Visit  Medication Sig Dispense Refill  . ibuprofen (ADVIL) 600 MG tablet Take 1 tablet (600 mg total) by mouth every 8 (eight) hours as needed. STOP meloxicam 30 tablet 0  . meloxicam (MOBIC) 7.5 MG tablet Take 1 tablet (7.5 mg total) by mouth daily. 30 tablet 0   No facility-administered medications prior to visit.    Allergies  Allergen Reactions  . Sulfa Antibiotics Nausea And Vomiting and Other (See Comments)    Pain, body aches, headache, fever    Review of Systems  Constitutional: Positive for fatigue. Negative for activity change, chills and fever.       Fatigue at baseline  Cardiovascular:       Bilateral breast pain per HPI       Objective:    Physical Exam Exam conducted with a chaperone present Ishmael Holter, CMA).  Chest:     Chest wall: Tenderness present.  Breasts:     Right: Swelling, mass, nipple discharge and tenderness present.     Left: Swelling, mass and tenderness present. No nipple discharge.         BP 126/84   Pulse 78   Temp 97.8 F (36.6 C)   Resp 16   Ht 5\' 4"  (1.626 m)   Wt 110 lb (49.9 kg)   SpO2 98%   BMI 18.88 kg/m  Wt Readings from Last 3 Encounters:  07/29/20 110 lb (49.9 kg)  07/20/20 106 lb (48.1 kg)  07/09/20 109 lb (49.4 kg)    Health Maintenance Due  Topic Date Due  . Hepatitis C Screening  Never done  . COVID-19 Vaccine (1) Never done  . PAP-Cervical Cytology Screening  03/27/2020  . PAP SMEAR-Modifier  03/27/2020    There are no preventive care reminders to display for this patient.   No results found for: TSH Lab Results  Component Value Date   WBC 9.8 07/09/2020   HGB 16.1 (H) 07/09/2020   HCT 47.7 (H) 07/09/2020   MCV 93 07/09/2020   PLT 282 07/09/2020   Lab Results  Component Value Date   NA 136 07/09/2020   K 5.0 07/09/2020   CO2 21 07/09/2020   GLUCOSE 60 (L) 07/09/2020   BUN 9 07/09/2020   CREATININE 0.85 07/09/2020   BILITOT <0.2 07/09/2020   ALKPHOS 100 07/09/2020   AST 39 07/09/2020   ALT 58 (H) 07/09/2020   PROT 8.3 07/09/2020   ALBUMIN 5.4 (H) 07/09/2020   CALCIUM 10.0 07/09/2020   ANIONGAP 8 12/22/2017   Lab Results  Component Value Date   CHOL 269 (H) 07/09/2020   Lab Results  Component Value Date   HDL 63 07/09/2020   Lab Results  Component Value Date   LDLCALC 163 (H) 07/09/2020   Lab Results  Component Value Date   TRIG 233 (H) 07/09/2020   No results found for: CHOLHDL No results found for: 07/11/2020     Assessment & Plan:   Problem List Items Addressed This Visit      Other  Breast pain    -bilateral breast pain x3 weeks -palpable masses in both breasts on lateral aspect that is 2-3 inches long that extends under the nipple -unsure of etiology -Rx. augmentin for ?mastitis -she is already taking NSAIDs for pain -since she has right breast discharge, will check prolactin level -ordered u/s bilateral breasts          No orders of the defined types were placed in this  encounter.    [name], NP

## 2020-07-29 NOTE — Addendum Note (Signed)
Addended by: Bjorn Pippin on: 07/29/2020 04:46 PM   Modules accepted: Orders

## 2020-07-29 NOTE — Patient Instructions (Signed)
I will see you back in a week.  I sent in augmentin, which is an antibiotic in case there is an infectious etiology to your pain.  We will still get an ultrasound of both breasts.

## 2020-07-30 ENCOUNTER — Other Ambulatory Visit: Payer: Self-pay | Admitting: Nurse Practitioner

## 2020-07-30 DIAGNOSIS — N6452 Nipple discharge: Secondary | ICD-10-CM

## 2020-07-30 LAB — PROLACTIN: Prolactin: 32.6 ng/mL — ABNORMAL HIGH (ref 4.8–23.3)

## 2020-08-03 ENCOUNTER — Encounter: Payer: Self-pay | Admitting: "Endocrinology

## 2020-08-03 ENCOUNTER — Other Ambulatory Visit: Payer: Self-pay

## 2020-08-03 ENCOUNTER — Ambulatory Visit (INDEPENDENT_AMBULATORY_CARE_PROVIDER_SITE_OTHER): Payer: Medicaid Other | Admitting: "Endocrinology

## 2020-08-03 VITALS — BP 100/76 | HR 100 | Ht 64.0 in | Wt 106.0 lb

## 2020-08-03 DIAGNOSIS — E221 Hyperprolactinemia: Secondary | ICD-10-CM | POA: Diagnosis not present

## 2020-08-03 NOTE — Progress Notes (Signed)
Endocrinology Consult Note                                            08/03/2020, 5:14 PM   Subjective:    Patient ID: Tamara Harrell, female    DOB: 04-20-1996, PCP [name], NP   Past Medical History:  Diagnosis Date   Abdominal pain 03/12/2012   Anemia    Anxiety    Cholecystitis, acute 03/12/2012   Depression    Encounter for supervision of normal pregnancy in second trimester 03/27/2017    Clinic  [facility] Prenatal Labs Dating  LMP Blood type:   O pos Genetic Screen 1 Screen:    AFP:     Quad:     NIPS: Antibody: neg Anatomic [place]  neg at [facility] MFM u/s Rubella:  imm GTT Early:               Third trimester:  RPR:   neg Flu vaccine  HBsAg:   neg TDaP vaccine                                               Rhogam: n/a HIV:   neg Baby Food     Breastfeed                                         Hepatitis C    Insufficient prenatal care 05/24/2017   Late prenatal care affecting pregnancy 03/27/2017   @ 23wks   Past Surgical History:  Procedure Laterality Date   bone graft Right    arm   LAPAROSCOPIC CHOLECYSTECTOMY     Social History   Socioeconomic History   Marital status: Single    Spouse name: Not on file   Number of children: Not on file   Years of education: Not on file   Highest education level: Not on file  Occupational History   Not on file  Tobacco Use   Smoking status: Current Every Day Smoker    Packs/day: 0.25    Types: Cigarettes   Smokeless tobacco: Never Used  Vaping Use   Vaping Use: Never used  Substance and Sexual Activity   Alcohol use: Yes    Comment: occasional   Drug use: No   Sexual activity: Yes  Other Topics Concern   Not on file  Social History Narrative   Not on file   Social Determinants of Health   Financial Resource Strain: Not on file  Food Insecurity: Not on file  Transportation Needs: Not on file  Physical Activity: Not on file  Stress: Not on file  Social Connections: Not on file   Family  History  Problem Relation Age of Onset   Depression Mother    Miscarriages / [place] Mother    Vision loss Mother    Varicose Veins Mother    Hyperlipidemia Mother    Drug abuse Father    Cancer Maternal Aunt    Arthritis Maternal Grandmother    Cancer Maternal Grandmother    Hypertension Maternal Grandmother    Cancer Maternal Grandfather    Outpatient Encounter Medications as of  08/03/2020  Medication Sig   amoxicillin-clavulanate (AUGMENTIN) 875-125 MG tablet Take 1 tablet by mouth 2 (two) times daily.   ibuprofen (ADVIL) 600 MG tablet Take 1 tablet (600 mg total) by mouth every 8 (eight) hours as needed. STOP meloxicam   [DISCONTINUED] meloxicam (MOBIC) 7.5 MG tablet Take 1 tablet (7.5 mg total) by mouth daily. (Patient not taking: Reported on 08/03/2020)   No facility-administered encounter medications on file as of 08/03/2020.   ALLERGIES: Allergies  Allergen Reactions   Meloxicam Other (See Comments)    Fatigue, lightheaded   Sulfa Antibiotics Nausea And Vomiting and Other (See Comments)    Pain, body aches, headache, fever    VACCINATION STATUS: Immunization History  Administered Date(s) Administered   Tdap 05/25/2017    HPI Tamara Harrell is 24 y.o. female who presents today with a medical history as above. she is being seen in consultation for hyperprolactinemia requested by [name], NP.  History is obtained directly from the patient as well as chart review.  She was experiencing breast fullness and some discharge earlier this month.  Subsequently, she was tested for pregnancy and prolactin levels.  Pregnancy test was negative, prolactin was slightly elevated at 32.6 on July 29, 2020.  She has contraceptive implant, gets monthly menstrual cycles-last cycle was November 06-07-2020.    Reports oligomenorrhea, decreased libido.  She is a mother of 2 young children age 48 in 3 years.  She does not have an immediate plan for another  pregnancy.  She is a patient with hepatitis C, does not know the degree of her disease or if she has cirrhosis. She did not have brain/pituitary imaging. Her medications include ibuprofen, amoxicillin.  She is not on antidepressants or antipsychotics.  She is active daily smoker.  Review of Systems  Constitutional: + Fluctuating body weight,+ fatigue, no subjective hyperthermia, no subjective hypothermia Eyes: no blurry vision, no xerophthalmia ENT: no sore throat, no nodules palpated in throat, no dysphagia/odynophagia, no hoarseness Cardiovascular: no Chest Pain, no Shortness of Breath, no palpitations, no leg swelling Respiratory: no cough, no shortness of breath Gastrointestinal: no Nausea/Vomiting/Diarhhea Musculoskeletal: no muscle/joint aches Skin: no rashes Neurological: no tremors, no numbness, no tingling, no dizziness Psychiatric: no depression, no anxiety  Objective:    Vitals with BMI 08/03/2020 07/29/2020 07/20/2020  Height 5\' 4"  5\' 4"  5\' 0"   Weight 106 lbs 110 lbs 106 lbs  BMI 18.19 18.87 20.7  Systolic 100 126  Diastolic 76 84 89  Pulse 100 78 110    BP 100/76    Pulse 100    Ht 5\' 4"  (1.626 m)    Wt 106 lb (48.1 kg)    BMI 18.19 kg/m   Wt Readings from Last 3 Encounters:  08/03/20 106 lb (48.1 kg)  07/29/20 110 lb (49.9 kg)  07/20/20 106 lb (48.1 kg)    Physical Exam  Constitutional:  Body mass index is 18.19 kg/m.,  not in acute distress, normal state of mind Eyes: PERRLA, EOMI, no exophthalmos ENT: moist mucous membranes, no gross thyromegaly, no gross cervical lymphadenopathy Cardiovascular: normal precordial activity, Regular Rate and Rhythm, no Murmur/Rubs/Gallops Respiratory:  adequate breathing efforts, no gross chest deformity, Clear to auscultation bilaterally Gastrointestinal: abdomen soft, Non -tender, No distension, Bowel Sounds present, no gross organomegaly Musculoskeletal: no gross deformities, strength intact in all four  extremities Skin: moist, warm, no rashes Neurological: no tremor with outstretched hands, Deep tendon reflexes normal in bilateral lower extremities.  CMP ( most recent)  CMP     Component Value Date/Time   NA 136 07/09/2020 0931   K 5.0 07/09/2020 0931   CL 98 07/09/2020 0931   CO2 21 07/09/2020 0931   GLUCOSE 60 (L) 07/09/2020 0931   GLUCOSE 94 12/22/2017 1920   BUN 9 07/09/2020 0931   CREATININE 0.85 07/09/2020 0931   CALCIUM 10.0 07/09/2020 0931   PROT 8.3 07/09/2020 0931   ALBUMIN 5.4 (H) 07/09/2020 0931   AST 39 07/09/2020 0931   ALT 58 (H) 07/09/2020 0931   ALKPHOS 100 07/09/2020 0931   BILITOT <0.2 07/09/2020 0931   GFRNONAA 95 07/09/2020 0931   GFRAA 109 07/09/2020 0931     Diabetic Labs (most recent): No results found for: HGBA1C   Lipid Panel ( most recent) Lipid Panel     Component Value Date/Time   CHOL 269 (H) 07/09/2020 0931   TRIG 233 (H) 07/09/2020 0931   HDL 63 07/09/2020 0931   LDLCALC 163 (H) 07/09/2020 0931   LABVLDL 43 (H) 07/09/2020 0931      Lab Results  Component Value Date   TSH 2.390 07/29/2020   FREET4 1.03 07/29/2020     Assessment & Plan:   1. Hyperprolactinemia (HCC)  - CHRYSTLE MURILLO  is being seen at a kind request of [name], NP. - I have reviewed her available endocrine records and clinically evaluated the patient.  Rest exam was deferred.  She does not have thyroid dysfunction, pregnancy test was also negative recently. - Based on these reviews, she has hyperprolactinemia of 32.6 (normal 4.8-23.3, on July 29, 2020). however,  there is not sufficient information to proceed with definitive treatment plan. She will have repeat prolactin measurement at fasting, during follicular phase of her next menstrual cycle, without nipple/breast manipulation nor shower. -If she is found to have persistent, significant hyperprolactinemia, she will be considered for MRI of the sella/pituitary. I had a long discussion about  possible causes of hyperprolactinemia with her.  She does have unexplained stress, and sleep disturbance which may cause partial elevation of prolactin. The patient was counseled on the dangers of tobacco use, and was advised to quit.  Reviewed strategies to maximize success, including removing cigarettes and smoking materials from environment. She does not have an immediate plan for fertility, she will be considered for [place] intervention to lower prolactin if pituitary/sella imaging is unremarkable.  - I did not initiate any new prescriptions today. - she is advised to maintain close follow up with [name], NP for primary care needs.   - Time spent with the patient: 60 minutes, of which >50% was spent in  counseling her about her hyperprolactinemia and the rest in obtaining information about her symptoms, reviewing her previous labs/studies ( including abstractions from other facilities),  evaluations, and treatments,  and developing a plan to confirm diagnosis and long term treatment based on the latest standards of care/guidelines; and documenting her care.  Tamara Harrell participated in the discussions, expressed understanding, and voiced agreement with the above plans.  All questions were answered to her satisfaction. she is encouraged to contact clinic should she have any questions or concerns prior to her return visit.  Follow up plan: Return in about 2 weeks (around 08/17/2020) for F/U with Pre-visit Labs.   Marquis Lunch, MD [facility] [address] [place], [place] [address] Phone: [phone]  Fax: [phone]     08/03/2020, 5:14 PM  This note was partially dictated  with voice recognition software. Similar sounding words can be transcribed inadequately or may not  be corrected upon review.

## 2020-08-04 LAB — SPECIMEN STATUS REPORT

## 2020-08-04 LAB — T4, FREE: Free T4: 1.03 ng/dL (ref 0.82–1.77)

## 2020-08-04 LAB — TSH: TSH: 2.39 u[IU]/mL (ref 0.450–4.500)

## 2020-08-04 LAB — BETA HCG QUANT (REF LAB): hCG Quant: <1 m[IU]/mL

## 2020-08-05 ENCOUNTER — Encounter: Payer: Self-pay | Admitting: Internal Medicine

## 2020-08-05 ENCOUNTER — Encounter: Payer: Self-pay | Admitting: Nurse Practitioner

## 2020-08-05 ENCOUNTER — Other Ambulatory Visit: Payer: Self-pay

## 2020-08-05 ENCOUNTER — Ambulatory Visit (INDEPENDENT_AMBULATORY_CARE_PROVIDER_SITE_OTHER): Payer: Medicaid Other | Admitting: Nurse Practitioner

## 2020-08-05 VITALS — BP 111/79 | HR 102 | Temp 98.6°F | Resp 18 | Ht 60.0 in | Wt 107.0 lb

## 2020-08-05 DIAGNOSIS — B1920 Unspecified viral hepatitis C without hepatic coma: Secondary | ICD-10-CM | POA: Insufficient documentation

## 2020-08-05 DIAGNOSIS — K730 Chronic persistent hepatitis, not elsewhere classified: Secondary | ICD-10-CM | POA: Diagnosis not present

## 2020-08-05 DIAGNOSIS — B182 Chronic viral hepatitis C: Secondary | ICD-10-CM

## 2020-08-05 DIAGNOSIS — N644 Mastodynia: Secondary | ICD-10-CM

## 2020-08-05 DIAGNOSIS — E221 Hyperprolactinemia: Secondary | ICD-10-CM

## 2020-08-05 NOTE — Assessment & Plan Note (Signed)
-  still having nipple discharge, but output has decreased some
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
## 2020-08-05 NOTE — Assessment & Plan Note (Signed)
-  no improvement with augmentin; stop this -some help with ibuprofen, but not much -may be related to HCV which was untreated; will check HCV labs today

## 2020-08-10 ENCOUNTER — Telehealth: Payer: Self-pay | Admitting: "Endocrinology

## 2020-08-10 NOTE — Telephone Encounter (Signed)
Pt called and left a voicemail about getting labs done. Attempted to call pt back to inform her we do not do labs here but she can go to her PCP office and get that done. Pt did not answer.

## 2020-08-12 LAB — HCV AB W/RFLX TO VERIFICATION: HCV Ab: >11 s/co ratio — ABNORMAL HIGH (ref 0.0–0.9)

## 2020-08-12 LAB — INTERPRETATION:

## 2020-08-12 LAB — HCV RNA NAA QUALITATIVE: HCV RNA NAA QUALITATIVE: POSITIVE — AB

## 2020-08-12 NOTE — Progress Notes (Signed)
Her hepatitis C panel was still positive.  Please keep the appointment with GI, and they work on getting the medicine to cure this condition, if indicated.  I suspect they will draw a few more tests for confirmation first.

## 2020-08-17 ENCOUNTER — Ambulatory Visit: Payer: Medicaid Other | Admitting: "Endocrinology

## 2020-08-19 ENCOUNTER — Ambulatory Visit: Payer: Medicaid Other | Admitting: Nurse Practitioner

## 2020-08-23 ENCOUNTER — Encounter: Payer: Medicaid Other | Admitting: Women's Health

## 2020-08-25 NOTE — Progress Notes (Deleted)
Office Visit Note  Patient: Tamara Harrell             Date of Birth: [birth year]           MRN: [id number]             PCP: [name], NP Referring: [name], NP Visit Date: 08/26/2020 Occupation: @GUAROCC @  Subjective:  No chief complaint on file.   History of Present Illness: Tamara Harrell is a 25 y.o. female here for evaluation of arthralgias.***   Labs reviewed 07/2021 HCV Ab >11.0/positive   Activities of Daily Living:  Patient reports morning stiffness for *** {minute/hour:19697}.   Patient {ACTIONS;DENIES/REPORTS:21021675::"Denies"} nocturnal pain.  Difficulty dressing/grooming: {ACTIONS;DENIES/REPORTS:21021675::"Denies"} Difficulty climbing stairs: {ACTIONS;DENIES/REPORTS:21021675::"Denies"} Difficulty getting out of chair: {ACTIONS;DENIES/REPORTS:21021675::"Denies"} Difficulty using hands for taps, buttons, cutlery, and/or writing: {ACTIONS;DENIES/REPORTS:21021675::"Denies"}  No Rheumatology ROS completed.   PMFS History:  Patient Active Problem List   Diagnosis Date Noted  . HCV (hepatitis C virus) 08/05/2020  . 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 Breast pain 07/29/2020  . Hyperlipidemia 07/20/2020  . Acute pain of both knees 07/19/2020    Past Medical History:  Diagnosis Date  . Abdominal pain 03/12/2012  . Anemia   . Anxiety   . Cholecystitis, acute 03/12/2012  . Depression   . Encounter for supervision of normal pregnancy in second trimester 03/27/2017    Clinic  [facility] Prenatal Labs Dating  LMP Blood type:   O pos Genetic Screen 1 Screen:    AFP:     Quad:     NIPS: Antibody: neg Anatomic 05/27/2017  neg at [facility] MFM u/s Rubella:  imm GTT Early:               Third trimester:  RPR:   neg Flu vaccine  HBsAg:   neg TDaP vaccine                                               Rhogam: n/a HIV:   neg Baby Food     Breastfeed                                        . Hepatitis C   . Insufficient prenatal care 05/24/2017  . Late prenatal care affecting  pregnancy 03/27/2017   @ 23wks    Family History  Problem Relation Age of Onset  . Depression Mother   . Miscarriages / 05/27/2017 Mother   . Vision loss Mother   . Varicose Veins Mother   . Hyperlipidemia Mother   . Drug abuse Father   . Cancer Maternal Aunt   . Arthritis Maternal Grandmother   . Cancer Maternal Grandmother   . Hypertension Maternal Grandmother   . Cancer Maternal Grandfather    Past Surgical History:  Procedure Laterality Date  . bone graft Right    arm  . LAPAROSCOPIC CHOLECYSTECTOMY     Social History   Social History Narrative  . Not on file   Immunization History  Administered Date(s) Administered  . Tdap 05/25/2017     Objective: Vital Signs: There were no vitals taken for this visit.   Physical Exam   Musculoskeletal Exam: ***  CDAI Exam: CDAI Score: -- Patient Global: --; Provider Global: -- Swollen: --; Tender: --  Joint Exam 08/26/2020   No joint exam has been documented for this visit   There is currently no information documented on the homunculus. Go to the Rheumatology activity and complete the homunculus joint exam.  Investigation: No additional findings.  Imaging: No results found.  Recent Labs: Lab Results  Component Value Date   WBC 9.8 07/09/2020   HGB 16.1 (H) 07/09/2020   PLT 282 07/09/2020   NA 136 07/09/2020   K 5.0 07/09/2020   CL 98 07/09/2020   CO2 21 07/09/2020   GLUCOSE 60 (L) 07/09/2020   BUN 9 07/09/2020   CREATININE 0.85 07/09/2020   BILITOT <0.2 07/09/2020   ALKPHOS 100 07/09/2020   AST 39 07/09/2020   ALT 58 (H) 07/09/2020   PROT 8.3 07/09/2020   ALBUMIN 5.4 (H) 07/09/2020   CALCIUM 10.0 07/09/2020   GFRAA 109 07/09/2020    Speciality Comments: No specialty comments available.  Procedures:  No procedures performed Allergies: Meloxicam and Sulfa antibiotics   Assessment / Plan:     Visit Diagnoses: No diagnosis found.  Orders: No orders of the defined types were placed in this  encounter.  No orders of the defined types were placed in this encounter.   Face-to-face time spent with patient was *** minutes. Greater than 50% of time was spent in counseling and coordination of care.  Follow-Up Instructions: No follow-ups on file.   Fuller Plan, MD  Note - This record has been created using [employer].  Chart creation errors have been sought, but may not always  have been located. Such creation errors do not reflect on  the standard of medical care.

## 2020-08-26 ENCOUNTER — Ambulatory Visit: Payer: Medicaid Other | Admitting: Internal Medicine

## 2020-08-26 NOTE — Progress Notes (Deleted)
Office Visit Note  Patient: Tamara Harrell             Date of Birth: 06-May-1996           MRN: [id number]             PCP: [name], NP Referring: [name], NP Visit Date: 08/27/2020 Occupation: @GUAROCC @  Subjective:  No chief complaint on file.   History of Present Illness: Tamara Harrell is a 25 y.o. female here for evaluation of joint pains.***  Labs reviewed  07/2020 HCV positive   06/2020 ANA positive dsDNA negative Complement C3 normal  Imaging reviewed 06/2020 Bilateral knee xrays No significant abnormalities  02/2020 Right foot xray Acute nondisplaced 2nd metatarsal fracture  Activities of Daily Living:  Patient reports morning stiffness for *** {minute/hour:19697}.   Patient {ACTIONS;DENIES/REPORTS:21021675::"Denies"} nocturnal pain.  Difficulty dressing/grooming: {ACTIONS;DENIES/REPORTS:21021675::"Denies"} Difficulty climbing stairs: {ACTIONS;DENIES/REPORTS:21021675::"Denies"} Difficulty getting out of chair: {ACTIONS;DENIES/REPORTS:21021675::"Denies"} Difficulty using hands for taps, buttons, cutlery, and/or writing: {ACTIONS;DENIES/REPORTS:21021675::"Denies"}  No Rheumatology ROS completed.   PMFS History:  Patient Active Problem List   Diagnosis Date Noted  . HCV (hepatitis C virus) 08/05/2020  . 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 Breast pain 07/29/2020  . Hyperlipidemia 07/20/2020  . Acute pain of both knees 07/19/2020    Past Medical History:  Diagnosis Date  . Abdominal pain 03/12/2012  . Anemia   . Anxiety   . Cholecystitis, acute 03/12/2012  . Depression   . Encounter for supervision of normal pregnancy in second trimester 03/27/2017    Clinic  [facility] Prenatal Labs Dating  LMP Blood type:   O pos Genetic Screen 1 Screen:    AFP:     Quad:     NIPS: Antibody: neg Anatomic 05/27/2017  neg at [facility] MFM u/s Rubella:  imm GTT Early:               Third trimester:  RPR:   neg Flu vaccine  HBsAg:   neg TDaP vaccine                                                Rhogam: n/a HIV:   neg Baby Food     Breastfeed                                        . Hepatitis C   . Insufficient prenatal care 05/24/2017  . Late prenatal care affecting pregnancy 03/27/2017   @ 23wks    Family History  Problem Relation Age of Onset  . Depression Mother   . Miscarriages / 05/27/2017 Mother   . Vision loss Mother   . Varicose Veins Mother   . Hyperlipidemia Mother   . Drug abuse Father   . Cancer Maternal Aunt   . Arthritis Maternal Grandmother   . Cancer Maternal Grandmother   . Hypertension Maternal Grandmother   . Cancer Maternal Grandfather    Past Surgical History:  Procedure Laterality Date  . bone graft Right    arm  . LAPAROSCOPIC CHOLECYSTECTOMY     Social History   Social History Narrative  . Not on file   Immunization History  Administered Date(s) Administered  . Tdap 05/25/2017     Objective: Vital Signs: There were no  vitals taken for this visit.   Physical Exam   Musculoskeletal Exam: ***  CDAI Exam: CDAI Score: -- Patient Global: --; Provider Global: -- Swollen: --; Tender: -- Joint Exam 08/27/2020   No joint exam has been documented for this visit   There is currently no information documented on the homunculus. Go to the Rheumatology activity and complete the homunculus joint exam.  Investigation: No additional findings.  Imaging: No results found.  Recent Labs: Lab Results  Component Value Date   WBC 9.8 07/09/2020   HGB 16.1 (H) 07/09/2020   PLT 282 07/09/2020   NA 136 07/09/2020   K 5.0 07/09/2020   CL 98 07/09/2020   CO2 21 07/09/2020   GLUCOSE 60 (L) 07/09/2020   BUN 9 07/09/2020   CREATININE 0.85 07/09/2020   BILITOT <0.2 07/09/2020   ALKPHOS 100 07/09/2020   AST 39 07/09/2020   ALT 58 (H) 07/09/2020   PROT 8.3 07/09/2020   ALBUMIN 5.4 (H) 07/09/2020   CALCIUM 10.0 07/09/2020   GFRAA 109 07/09/2020    Speciality Comments: No specialty comments available.  Procedures:   No procedures performed Allergies: Meloxicam and Sulfa antibiotics   Assessment / Plan:     Visit Diagnoses: No diagnosis found.  Orders: No orders of the defined types were placed in this encounter.  No orders of the defined types were placed in this encounter.   Face-to-face time spent with patient was *** minutes. Greater than 50% of time was spent in counseling and coordination of care.  Follow-Up Instructions: No follow-ups on file.   Fuller Plan, MD  Note - This record has been created using [employer].  Chart creation errors have been sought, but may not always  have been located. Such creation errors do not reflect on  the standard of medical care.

## 2020-08-27 ENCOUNTER — Ambulatory Visit: Payer: Medicaid Other | Admitting: Internal Medicine

## 2020-09-23 ENCOUNTER — Encounter: Payer: Self-pay | Admitting: Internal Medicine

## 2020-09-23 ENCOUNTER — Ambulatory Visit: Payer: Medicaid Other | Admitting: Nurse Practitioner
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
## 2020-11-18 ENCOUNTER — Encounter: Payer: Medicaid Other | Admitting: Nurse Practitioner

## 2021-06-21 ENCOUNTER — Encounter (HOSPITAL_COMMUNITY): Payer: Self-pay | Admitting: *Deleted

## 2021-06-21 ENCOUNTER — Other Ambulatory Visit: Payer: Self-pay

## 2021-06-21 ENCOUNTER — Emergency Department (HOSPITAL_COMMUNITY)
Admission: EM | Admit: 2021-06-21 | Discharge: 2021-06-22 | Disposition: A | Discharge status: Home / Self Care | Payer: Medicaid Other | Attending: Emergency Medicine | Admitting: Emergency Medicine

## 2021-06-21 DIAGNOSIS — F1590 Other stimulant use, unspecified, uncomplicated: Secondary | ICD-10-CM | POA: Insufficient documentation

## 2021-06-21 DIAGNOSIS — F329 Major depressive disorder, single episode, unspecified: Secondary | ICD-10-CM | POA: Insufficient documentation

## 2021-06-21 DIAGNOSIS — Y901 Blood alcohol level of 20-39 mg/100 ml: Secondary | ICD-10-CM | POA: Insufficient documentation

## 2021-06-21 DIAGNOSIS — F17210 Nicotine dependence, cigarettes, uncomplicated: Secondary | ICD-10-CM | POA: Insufficient documentation

## 2021-06-21 DIAGNOSIS — R45851 Suicidal ideations: Secondary | ICD-10-CM | POA: Insufficient documentation

## 2021-06-21 DIAGNOSIS — F1910 Other psychoactive substance abuse, uncomplicated: Secondary | ICD-10-CM | POA: Insufficient documentation

## 2021-06-21 LAB — COMPREHENSIVE METABOLIC PANEL
ALT: 27 U/L (ref 0–44)
AST: 22 U/L (ref 15–41)
Albumin: 4.6 g/dL (ref 3.5–5.0)
Alkaline Phosphatase: 63 U/L (ref 38–126)
Anion gap: 9 (ref 5–15)
BUN: 12 mg/dL (ref 6–20)
CO2: 22 mmol/L (ref 22–32)
Calcium: 8.9 mg/dL (ref 8.9–10.3)
Chloride: 103 mmol/L (ref 98–111)
Creatinine, Ser: 0.73 mg/dL (ref 0.44–1.00)
GFR, Estimated: >60 mL/min (ref >60)
Glucose, Bld: 76 mg/dL (ref 70–99)
Potassium: 3.2 mmol/L — ABNORMAL LOW (ref 3.5–5.1)
Sodium: 134 mmol/L — ABNORMAL LOW (ref 135–145)
Total Bilirubin: 0.9 mg/dL (ref 0.3–1.2)
Total Protein: 8 g/dL (ref 6.5–8.1)

## 2021-06-21 LAB — SALICYLATE LEVEL: Salicylate Lvl: <7 mg/dL — ABNORMAL LOW (ref 7.0–30.0)

## 2021-06-21 LAB — CBC
HCT: 44.3 % (ref 36.0–46.0)
Hemoglobin: 15 g/dL (ref 12.0–15.0)
MCH: 32.3 pg (ref 26.0–34.0)
MCHC: 33.9 g/dL (ref 30.0–36.0)
MCV: 95.3 fL (ref 80.0–100.0)
Platelets: 221 10*3/uL (ref 150–400)
RBC: 4.65 MIL/uL (ref 3.87–5.11)
RDW: 12 % (ref 11.5–15.5)
WBC: 9.8 10*3/uL (ref 4.0–10.5)
nRBC: 0 % (ref 0.0–0.2)

## 2021-06-21 LAB — ACETAMINOPHEN LEVEL: Acetaminophen (Tylenol), Serum: <10 ug/mL — ABNORMAL LOW (ref 10–30)

## 2021-06-21 LAB — ETHANOL: Alcohol, Ethyl (B): 22 mg/dL — ABNORMAL HIGH

## 2021-06-21 NOTE — ED Triage Notes (Signed)
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 Last meth use yesterday, last alcohol just PTA. Pt says she has had extreme paranoia, and SI today and over the past week. No specific SI.     [phone], Adelene Amas, pt contact if needed.

## 2021-06-21 NOTE — ED Provider Notes (Signed)
[facility] EMERGENCY DEPARTMENT Provider Note   CSN: [id number] Arrival date & time: 06/21/21  1913     History Chief Complaint  Patient presents with   Drug Problem   Suicidal    Tamara Harrell is a 25 y.o. female.  She has a history of depression substance abuse hep C.  She is complaining of increased anxiety and feeling of mental health crisis coming on.  Has had thoughts of suicide.  No specific plan.  She is actively using methamphetamines and alcohol.  Currently staying with her sister.  No medical complaints.  The history is provided by the patient.  Mental Health Problem Presenting symptoms: paranoid behavior and suicidal thoughts   Degree of incapacity (severity):  Unable to specify Onset quality:  Gradual Duration:  2 weeks Timing:  Constant Progression:  Worsening Chronicity:  Recurrent Context: alcohol use and drug abuse   Relieved by:  Nothing Worsened by:  Nothing Ineffective treatments:  None tried Associated symptoms: anxiety   Associated symptoms: no abdominal pain, no chest pain and no headaches   Risk factors: hx of mental illness       Past Medical History:  Diagnosis Date   Abdominal pain 03/12/2012   Anemia    Anxiety    Cholecystitis, acute 03/12/2012   Depression    Encounter for supervision of normal pregnancy in second trimester 03/27/2017    Clinic  [facility] Prenatal Labs Dating  LMP Blood type:   O pos Genetic Screen 1 Screen:    AFP:     Quad:     NIPS: Antibody: neg Anatomic [place]  neg at [facility] MFM u/s Rubella:  imm GTT Early:               Third trimester:  RPR:   neg Flu vaccine  HBsAg:   neg TDaP vaccine                                               Rhogam: n/a HIV:   neg Baby Food     Breastfeed                                         Hepatitis C    Insufficient prenatal care 05/24/2017   Late prenatal care affecting pregnancy 03/27/2017   @ 23wks    Patient Active Problem List   Diagnosis Date Noted   HCV (hepatitis C virus) 08/05/2020    Hyperprolactinemia ([place]) 08/03/2020   Breast pain 07/29/2020   Hyperlipidemia 07/20/2020   Acute pain of both knees 07/19/2020    Past Surgical History:  Procedure Laterality Date   bone graft Right    arm   LAPAROSCOPIC CHOLECYSTECTOMY       OB History     Gravida  2   Para  2   Term  1   Preterm  1   AB      Living  2      SAB      IAB      Ectopic      Multiple  0   Live Births  2        Obstetric Comments  G1: 08/2014, SVD 6lbs 5oz, no issues or problems.  Family History  Problem Relation Age of Onset   Depression Mother    46 / [place] Mother    Vision loss Mother    Varicose Veins Mother    Hyperlipidemia Mother    Drug abuse Father    Cancer Maternal Aunt    Arthritis Maternal Grandmother    Cancer Maternal Grandmother    Hypertension Maternal Grandmother    Cancer Maternal Grandfather     Social History   Tobacco Use   Smoking status: Every Day    Packs/day: 0.25    Types: Cigarettes   Smokeless tobacco: Never  Vaping Use   Vaping Use: Never used  Substance Use Topics   Alcohol use: Yes    Comment: occasional   Drug use: No    Home Medications Prior to Admission medications   Medication Sig Start Date End Date Taking? Authorizing Provider  amoxicillin-clavulanate (AUGMENTIN) 875-125 MG tablet Take 1 tablet by mouth 2 (two) times daily. 07/29/20   Noreene Larsson, NP  ibuprofen (ADVIL) 600 MG tablet Take 1 tablet (600 mg total) by mouth every 8 (eight) hours as needed. STOP meloxicam 07/26/20   Noreene Larsson, NP    Allergies    Meloxicam and Sulfa antibiotics  Review of Systems   Review of Systems  Constitutional:  Negative for fever.  HENT:  Negative for sore throat.   Eyes:  Negative for visual disturbance.  Respiratory:  Negative for shortness of breath.   Cardiovascular:  Negative for chest pain.  Gastrointestinal:  Negative for abdominal pain.  Genitourinary:  Negative for dysuria.   Musculoskeletal:  Negative for neck pain.  Skin:  Negative for rash.  Neurological:  Negative for headaches.  Psychiatric/Behavioral:  Positive for paranoia and suicidal ideas. The patient is nervous/anxious.    Physical Exam Updated Vital Signs BP 96/71   Pulse 71   Temp 98.2 F (36.8 C) (Oral)   Resp 20   Ht 5' (1.524 m)   Wt 45.4 kg   SpO2 97%   BMI 19.53 kg/m   Physical Exam Vitals and nursing note reviewed.  Constitutional:      General: She is not in acute distress.    Appearance: Normal appearance. She is well-developed.  HENT:     Head: Normocephalic and atraumatic.  Eyes:     Conjunctiva/sclera: Conjunctivae normal.  Cardiovascular:     Rate and Rhythm: Normal rate and regular rhythm.     Heart sounds: No murmur heard. Pulmonary:     Effort: Pulmonary effort is normal. No respiratory distress.     Breath sounds: Normal breath sounds.  Abdominal:     Palpations: Abdomen is soft.     Tenderness: There is no abdominal tenderness. There is no guarding or rebound.  Musculoskeletal:        General: No deformity or signs of injury. Normal range of motion.     Cervical back: Neck supple.  Skin:    General: Skin is warm and dry.  Neurological:     General: No focal deficit present.     Mental Status: She is alert.    ED Results / Procedures / Treatments   Labs (all labs ordered are listed, but only abnormal results are displayed) Labs Reviewed  COMPREHENSIVE METABOLIC PANEL - Abnormal; Notable for the following components:      Result Value   Sodium 134 (*)    Potassium 3.2 (*)    All other components within normal limits  ETHANOL - Abnormal; Notable  for the following components:   Alcohol, Ethyl (B) 22 (*)    All other components within normal limits  SALICYLATE LEVEL - Abnormal; Notable for the following components:   Salicylate Lvl <7.0 (*)    All other components within normal limits  ACETAMINOPHEN LEVEL - Abnormal; Notable for the following  components:   Acetaminophen (Tylenol), Serum <10 (*)    All other components within normal limits  RAPID URINE DRUG SCREEN, HOSP PERFORMED - Abnormal; Notable for the following components:   Amphetamines POSITIVE (*)    Tetrahydrocannabinol POSITIVE (*)    All other components within normal limits  RESP PANEL BY RT-PCR (FLU A&B, COVID) ARPGX2  CBC  PREGNANCY, URINE    EKG None  Radiology No results found.  Procedures Procedures   Medications Ordered in ED Medications - No data to display  ED Course  I have reviewed the triage vital signs and the nursing notes.  Pertinent labs & imaging results that were available during my care of the patient were reviewed by me and considered in my medical decision making (see chart for details).    MDM Rules/Calculators/A&P                          This patient complains of increased anxiety polysubstance abuse suicidal thoughts; this involves an extensive number of treatment Options and is a complaint that carries with it a high risk of complications and Morbidity. The differential includes substance abuse, mental health issues  I ordered, reviewed and interpreted labs, which included CBC with normal white count normal hemoglobin, chemistries with mildly low potassium, aspirin Tylenol negative, alcohol mildly elevated, tox positive for amphetamines THC, pregnancy test neg Previous records obtained and reviewed in epic no recent missions I consulted TTS and discussed lab and imaging findings  Critical Interventions: None  After the interventions stated above, I reevaluated the patient and found patient be cooperative voluntary.  She is on no chronic medications.  She is pending TTS evaluation and disposition is pending.   Final Clinical Impression(s) / ED Diagnoses Final diagnoses:  Suicidal ideation  Polysubstance abuse [facility] [place])    Rx / DC Orders ED Discharge Orders     None        Terrilee Files, MD 06/22/21 1047

## 2021-06-22 LAB — RAPID URINE DRUG SCREEN, HOSP PERFORMED
Amphetamines: POSITIVE — AB
Barbiturates: NOT DETECTED
Benzodiazepines: NOT DETECTED
Cocaine: NOT DETECTED
Opiates: NOT DETECTED
Tetrahydrocannabinol: POSITIVE — AB

## 2021-06-22 LAB — PREGNANCY, URINE: Preg Test, Ur: NEGATIVE
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
## 2021-06-22 NOTE — ED Notes (Signed)
Pt label on one bag of belongings, placed in locker

## 2021-06-22 NOTE — ED Notes (Signed)
Psych NP requested that CSW provide pt with substance use resources. Pt agreeable to accepting resources. CSW explained that pt will need to work on calling places from the community. TOC signing off.

## 2021-06-22 NOTE — Discharge Instructions (Signed)
Follow-up as suggested by behavioral health or follow-up with [facility] here in [place]

## 2021-06-22 NOTE — ED Notes (Addendum)
Pt discharged in stable condition with resources to follow up with [facility], pt verbalizes understanding of all discharge instructions. All belongings returned to pt.
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
## 2021-06-22 NOTE — ED Notes (Signed)
TTS complete 

## 2021-08-27 IMAGING — DX DG KNEE COMPLETE 4+V*R*
4 series · 4 of 4 positions shown · non-contrast
Comparison: None.

CLINICAL DATA: Right knee pain.

EXAM:
RIGHT KNEE - COMPLETE 4+ VIEW

[knee ap]
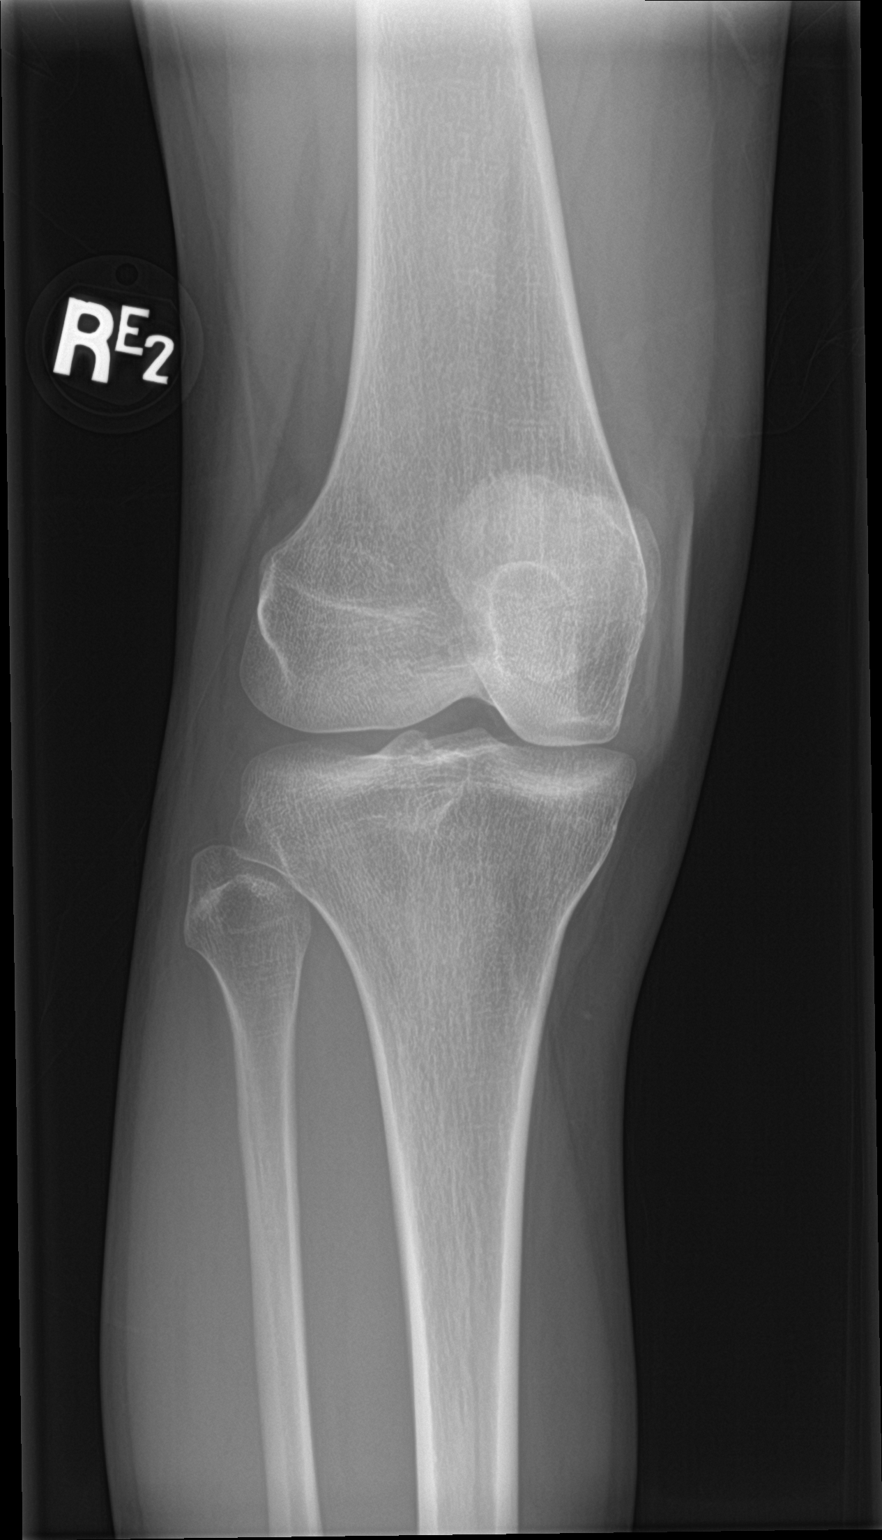

[knee obl (1 of 2)]
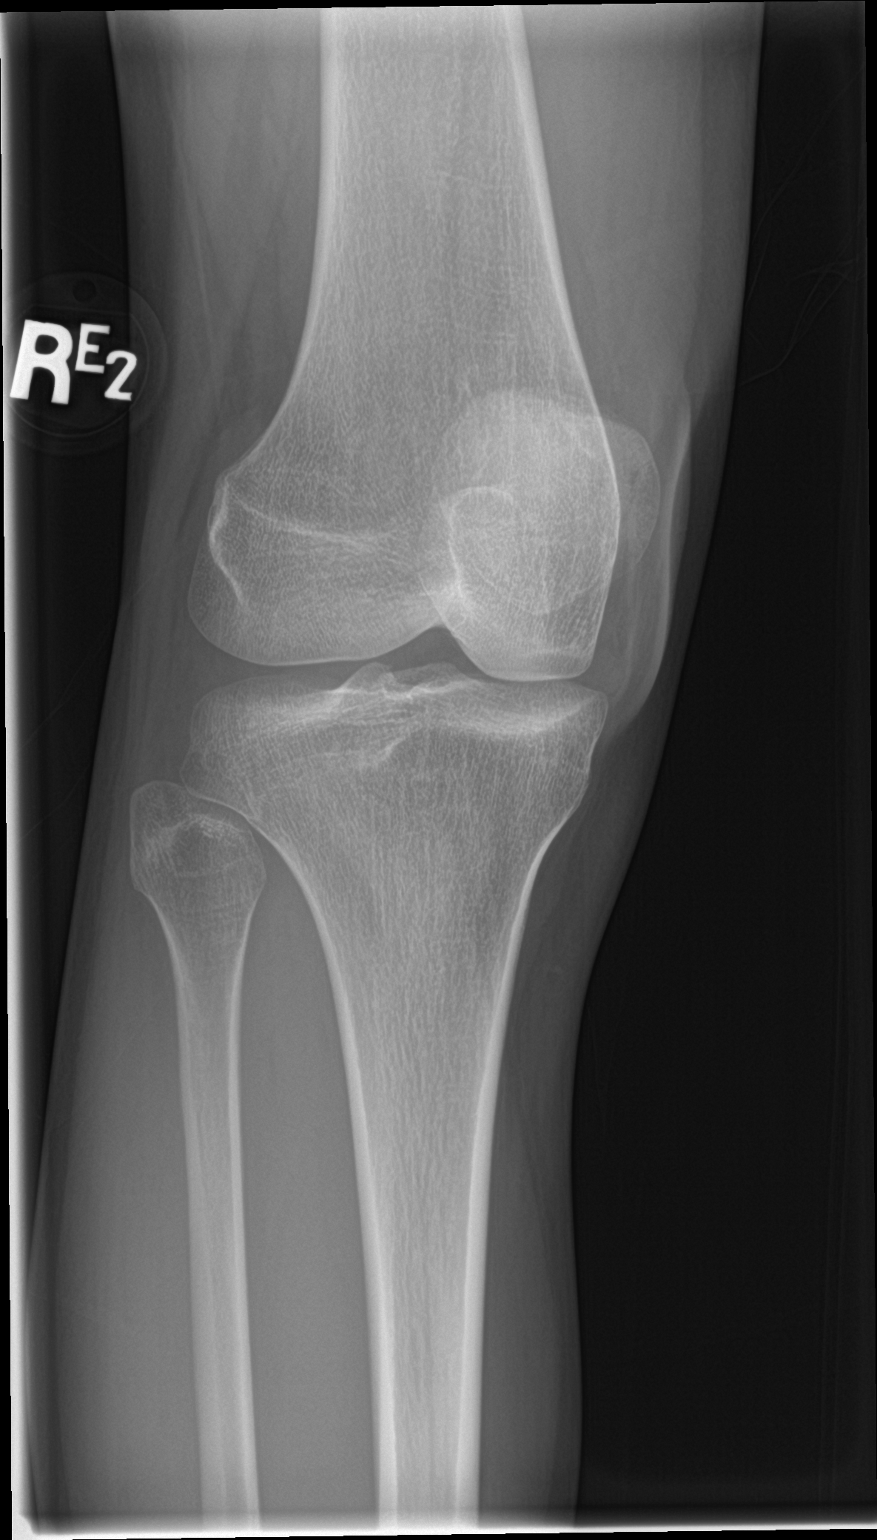

[knee obl (2 of 2)]
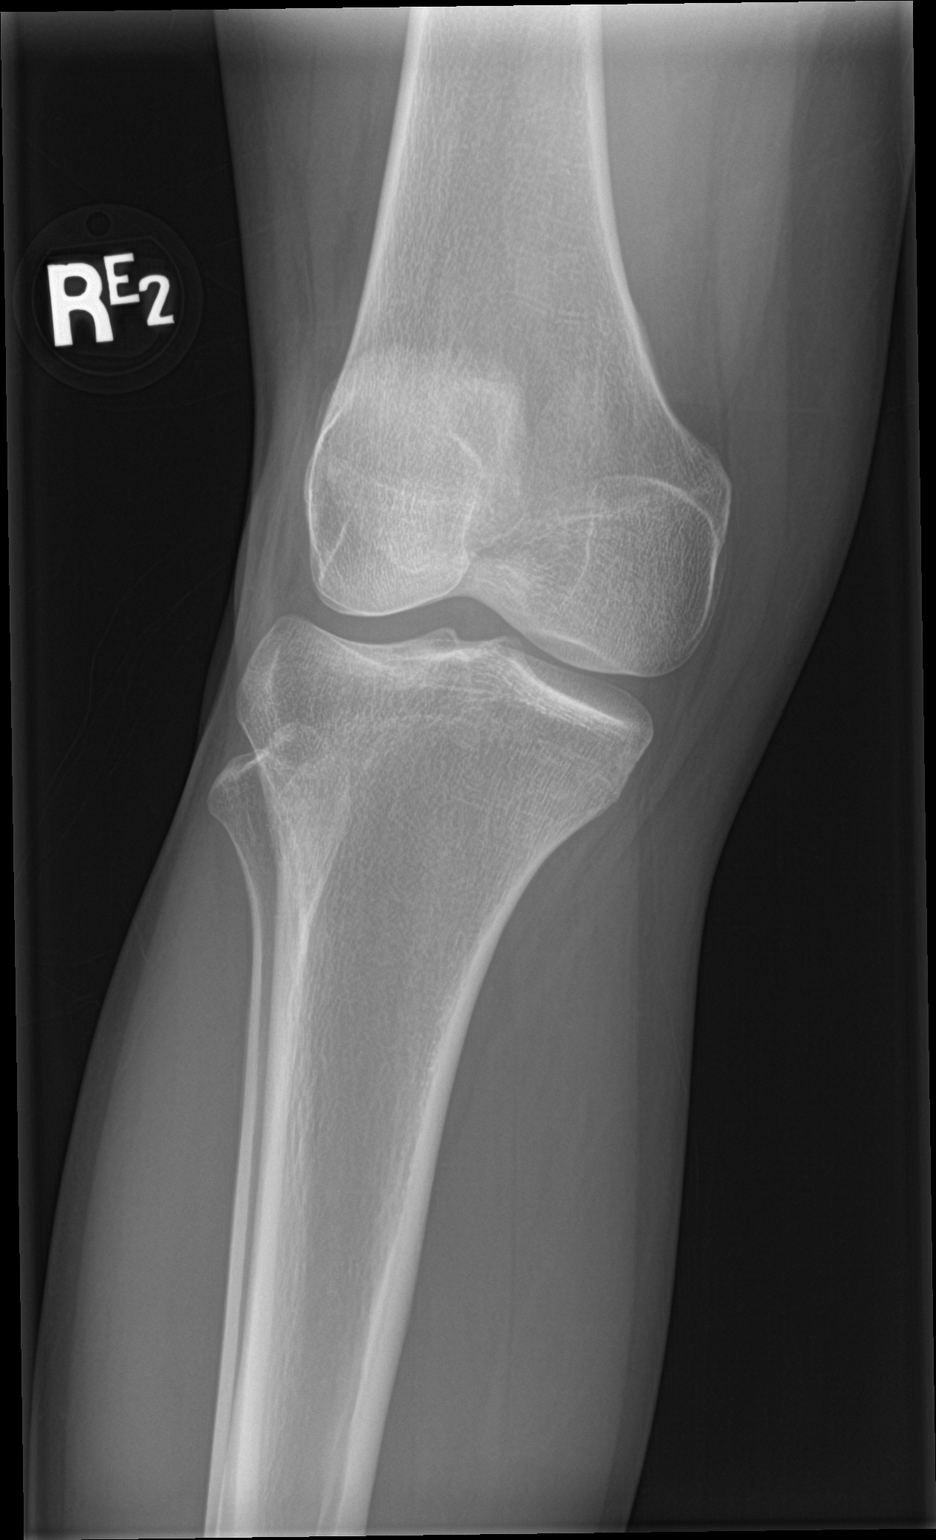

[knee lat]
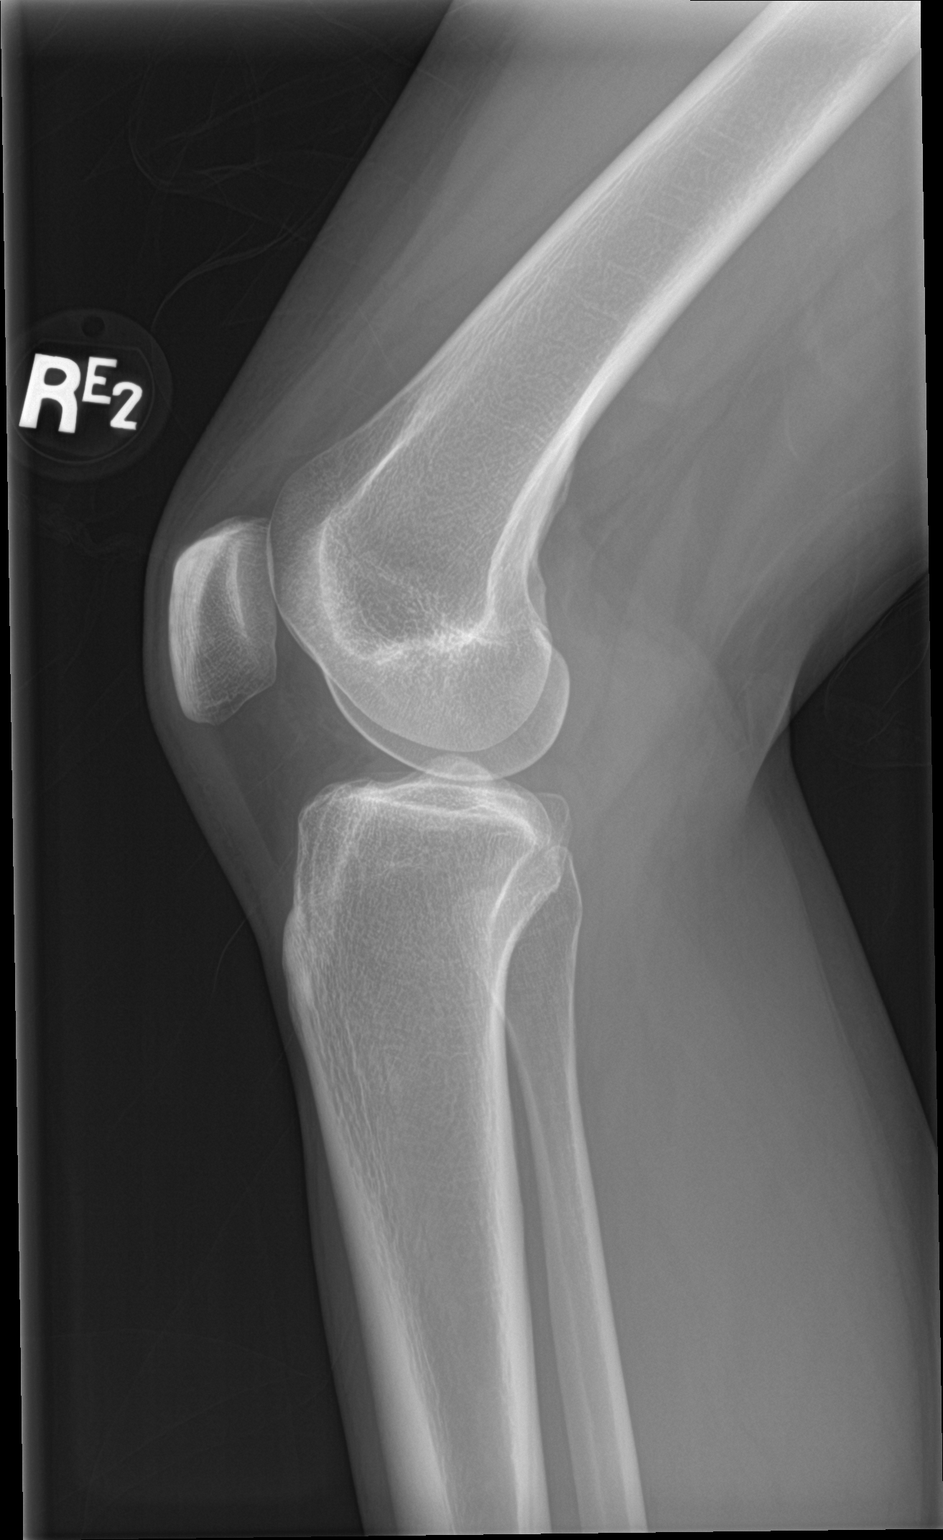

[4 of 4 positions shown; findings below may reference images not displayed]

FINDINGS: The joint spaces are normal. No acute bony findings or degenerative
changes. No osteochondral lesion. No joint effusion.
IMPRESSION: Normal right knee radiographs.

## 2021-11-24 ENCOUNTER — Other Ambulatory Visit: Payer: Self-pay

## 2021-11-24 ENCOUNTER — Encounter (HOSPITAL_COMMUNITY): Payer: Self-pay

## 2021-11-24 ENCOUNTER — Emergency Department (HOSPITAL_COMMUNITY)
Admission: EM | Admit: 2021-11-24 | Discharge: 2021-11-24 | Disposition: A | Discharge status: Home / Self Care | Payer: Medicaid Other | Source: Home / Self Care | Attending: Emergency Medicine | Admitting: Emergency Medicine

## 2021-11-24 ENCOUNTER — Ambulatory Visit (HOSPITAL_COMMUNITY)
Admission: EM | Admit: 2021-11-24 | Discharge: 2021-11-25 | Disposition: A | Discharge status: Home / Self Care | Payer: No Payment, Other | Attending: Psychiatry | Admitting: Psychiatry

## 2021-11-24 ENCOUNTER — Emergency Department (HOSPITAL_COMMUNITY)
Admission: EM | Admit: 2021-11-24 | Discharge: 2021-11-24 | Disposition: A | Discharge status: Home / Self Care | Payer: Medicaid Other | Attending: Emergency Medicine | Admitting: Emergency Medicine

## 2021-11-24 DIAGNOSIS — F1994 Other psychoactive substance use, unspecified with psychoactive substance-induced mood disorder: Secondary | ICD-10-CM | POA: Diagnosis not present

## 2021-11-24 DIAGNOSIS — F32A Depression, unspecified: Secondary | ICD-10-CM | POA: Diagnosis not present

## 2021-11-24 DIAGNOSIS — Z20822 Contact with and (suspected) exposure to covid-19: Secondary | ICD-10-CM | POA: Diagnosis not present

## 2021-11-24 DIAGNOSIS — R000 Tachycardia, unspecified: Secondary | ICD-10-CM | POA: Insufficient documentation

## 2021-11-24 DIAGNOSIS — F1910 Other psychoactive substance abuse, uncomplicated: Secondary | ICD-10-CM | POA: Insufficient documentation

## 2021-11-24 DIAGNOSIS — F419 Anxiety disorder, unspecified: Secondary | ICD-10-CM | POA: Insufficient documentation

## 2021-11-24 DIAGNOSIS — R4689 Other symptoms and signs involving appearance and behavior: Secondary | ICD-10-CM

## 2021-11-24 DIAGNOSIS — Z0279 Encounter for issue of other medical certificate: Secondary | ICD-10-CM | POA: Diagnosis not present

## 2021-11-24 DIAGNOSIS — F1990 Other psychoactive substance use, unspecified, uncomplicated: Secondary | ICD-10-CM | POA: Diagnosis present

## 2021-11-24 DIAGNOSIS — F411 Generalized anxiety disorder: Secondary | ICD-10-CM | POA: Diagnosis present

## 2021-11-24 LAB — CBC WITH DIFFERENTIAL/PLATELET
Abs Immature Granulocytes: 0.02 10*3/uL (ref 0.00–0.07)
Basophils Absolute: 0 10*3/uL (ref 0.0–0.1)
Basophils Relative: 0 %
Eosinophils Absolute: 0 10*3/uL (ref 0.0–0.5)
Eosinophils Relative: 0 %
HCT: 43 % (ref 36.0–46.0)
Hemoglobin: 15.1 g/dL — ABNORMAL HIGH (ref 12.0–15.0)
Immature Granulocytes: 0 %
Lymphocytes Relative: 30 %
Lymphs Abs: 3.3 10*3/uL (ref 0.7–4.0)
MCH: 31.9 pg (ref 26.0–34.0)
MCHC: 35.1 g/dL (ref 30.0–36.0)
MCV: 90.7 fL (ref 80.0–100.0)
Monocytes Absolute: 0.9 10*3/uL (ref 0.1–1.0)
Monocytes Relative: 9 %
Neutro Abs: 6.7 10*3/uL (ref 1.7–7.7)
Neutrophils Relative %: 61 %
Platelets: 248 10*3/uL (ref 150–400)
RBC: 4.74 MIL/uL (ref 3.87–5.11)
RDW: 12.1 % (ref 11.5–15.5)
WBC: 11 10*3/uL — ABNORMAL HIGH (ref 4.0–10.5)
nRBC: 0 % (ref 0.0–0.2)

## 2021-11-24 LAB — POCT URINE DRUG SCREEN - MANUAL ENTRY (I-SCREEN)
POC Amphetamine UR: POSITIVE — AB
POC Buprenorphine (BUP): NOT DETECTED
POC Cocaine UR: NOT DETECTED
POC Marijuana UR: POSITIVE — AB
POC Methadone UR: NOT DETECTED
POC Methamphetamine UR: POSITIVE — AB
POC Morphine: NOT DETECTED
POC Oxazepam (BZO): NOT DETECTED
POC Oxycodone UR: NOT DETECTED
POC Secobarbital (BAR): NOT DETECTED

## 2021-11-24 LAB — POC SARS CORONAVIRUS 2 AG: SARSCOV2ONAVIRUS 2 AG: NEGATIVE

## 2021-11-24 LAB — BASIC METABOLIC PANEL
Anion gap: 9 (ref 5–15)
BUN: 20 mg/dL (ref 6–20)
CO2: 23 mmol/L (ref 22–32)
Calcium: 8.7 mg/dL — ABNORMAL LOW (ref 8.9–10.3)
Chloride: 108 mmol/L (ref 98–111)
Creatinine, Ser: 0.78 mg/dL (ref 0.44–1.00)
GFR, Estimated: >60 mL/min (ref >60)
Glucose, Bld: 86 mg/dL (ref 70–99)
Potassium: 3.7 mmol/L (ref 3.5–5.1)
Sodium: 140 mmol/L (ref 135–145)

## 2021-11-24 LAB — POCT PREGNANCY, URINE: Preg Test, Ur: NEGATIVE

## 2021-11-24 LAB — POC SARS CORONAVIRUS 2 AG -  ED: SARS Coronavirus 2 Ag: NEGATIVE

## 2021-11-24 MED ORDER — ACETAMINOPHEN 325 MG PO TABS
650.0000 mg | ORAL_TABLET | Freq: Four times a day (QID) | ORAL | Status: DC | PRN
  Administered 2021-11-24 – 2021-11-25 (×2): 650 mg via ORAL
  Filled 2021-11-24 (×2): qty 2

## 2021-11-24 MED ORDER — HYDROXYZINE HCL 25 MG PO TABS
25.0000 mg | ORAL_TABLET | Freq: Three times a day (TID) | ORAL | Status: DC | PRN
  Administered 2021-11-24: 25 mg via ORAL
  Filled 2021-11-24: qty 1

## 2021-11-24 MED ORDER — SODIUM CHLORIDE 0.9 % IV BOLUS
1000.0000 mL | Freq: Once | INTRAVENOUS | Status: AC
  Administered 2021-11-24: 1000 mL via INTRAVENOUS

## 2021-11-24 MED ORDER — ALUM & MAG HYDROXIDE-SIMETH 200-200-20 MG/5ML PO SUSP: 30.0000 mL | ORAL | Status: DC | PRN

## 2021-11-24 MED ORDER — MAGNESIUM HYDROXIDE 400 MG/5ML PO SUSP: 30.0000 mL | Freq: Every day | ORAL | Status: DC | PRN

## 2021-11-24 NOTE — ED Notes (Signed)
Pt has bizarre behavior. Pt is staring off to the wall, she is laughing hysterically out to herself. Pt has been asked numerous of times to try and keep the talking loudly to herself low due to pt sleeping ?

## 2021-11-24 NOTE — BH Assessment (Addendum)
Comprehensive Clinical Assessment (CCA) Note ? ?11/24/2021 ?Tamara Harrell ?[id number] ? ?Chief Complaint:  ?Chief Complaint  ?Patient presents with  ? Depression  ? Anxiety  ? ?Visit Diagnosis:  ?Substance induced mood disorder ? ?Disposition:  ?Per Sindy Guadeloupe NP pt needs overnight observation [facility] for continuous assessment with psychiatric re evaluation in AM.  ?Flowsheet Row ED from 11/24/2021 in [facility] ?Most recent reading at 11/24/2021  8:43 PM ED from 11/24/2021 in [facility] ?Most recent reading at 11/24/2021  1:49 PM ED from 11/24/2021 in [facility] ?Most recent reading at 11/24/2021 10:51 AM  ?C-SSRS RISK CATEGORY No Risk No Risk No Risk  ? ?  ? ?The patient demonstrates the following risk factors for suicide: Chronic risk factors for suicide include: substance use disorder. Acute risk factors for suicide include: family or marital conflict. Protective factors for this patient include: religious beliefs against suicide. Considering these factors, the overall suicide risk at this point appears to be low. Patient is not appropriate for outpatient follow up. ? ? ?Tamara Harrell as in 26 year old female reporting to [facility] for evaluation of worsening depression and anxiety symptoms. Patient reports that she wants to ?check myself in because I am stressed and depressed?. patient is denying current suicidal ideation, homicidal ideation, or AVH. patient denies when asked any substance use within 24 hours. Patient reports that she used methamphetamine four days ago, and has not slipped since using meth.  Patient did not report this at triage but after record review patient was seen earlier today at [facility] long emergency room for evaluation of substance use. Patient was recommended to transfer to [facility] for detox. Pt was encouraged by EDP to follow up at [facility] for evaluation of symptoms ? ?Pt continued to be a poor historian throughout  assessment process and several inconsistencies in her story noted:  reason for ED visit today, previous psychiatric treatment/medication, substance use itself, goals for today's [facility] visit. Ultimately pt states that she wants help for her substance use, depression, and anxiety. Pt also states that she would like sleep and to take a shower.  ? ? ?CCA Screening, Triage and Referral (STR) ? ?Patient Reported Information ?How did you hear about [place]? Self ? ?What Is the Reason for Your Visit/Call Today? Tamara Harrell as in 26 year old female reporting to [facility] for evaluation of worsening depression and anxiety symptoms. Patient reports that she wants to ?check myself in because I am stressed and depressed?. patient is denying current suicidal ideation, homicidal ideation, or AVH. patient denies when asked any substance use within 24 hours. Patient reports that she used methamphetamine four days ago, and has not slipped since using meth.  Patient did not report this at triage but after record review patient was seen earlier today at [facility] long emergency room for evaluation of substance use. Patient was recommended to transfer to [facility] for detox. Pt was encouraged by EDP to follow up at [facility] for evaluation of symptoms. ? ?How Long Has This Been Causing You Problems? <Week ? ?What Do You Feel Would Help You the Most Today? Treatment for Depression or other mood problem (EDP at [facility] recommended alcohol/drug use treatment) ? ? ?Have You Recently Had Any Thoughts About Hurting Yourself? No ? ?Are You Planning to Commit Suicide/Harm Yourself At This time? No ? ? ?Have you Recently Had Thoughts About Hurting Someone Tamara Harrell? No ? ?Are You Planning to Harm Someone at This Time? No ? ?  Explanation: No data recorded ? ?Have You Used Any Alcohol or Drugs in the Past 24 Hours? No ([facility] but [facility] ? ?How Long Ago Did You Use Drugs or Alcohol? No data recorded ?What Did You Use and How Much?  No data recorded ? ?Do You Currently Have a Therapist/Psychiatrist? No (pt [place] [facility] [place]) ? ?Name of Therapist/Psychiatrist: No data recorded ? ?Have You Been Recently Discharged From Any Office Practice or Programs? No ? ?Explanation of Discharge From Practice/Program: No data recorded ? ?  ?CCA Screening Triage Referral Assessment ?Type of Contact: Face-to-Face ? ?Telemedicine Service Delivery:   ?Is this Initial or Reassessment? No data recorded ?Date Telepsych consult ordered in [facility]:  No data recorded ?Time Telepsych consult ordered in [facility]:  No data recorded ?[facility] ? ?[facility] ? ? ?Collateral Involvement: No data recorded ? ?Does Patient Have a Automotive engineerCourt Appointed Legal Guardian? No data recorded ?Name and Contact of Legal Guardian: No data recorded ?If Minor and Not Living with Parent(s), Who has Custody? No data recorded ?Is CPS involved or ever been involved? -- (UTA) ? ?Is APS involved or ever been involved? -- (UTA) ? ? ?Patient Determined To Be At Risk for Harm To Self or Others Based on Review of Patient Reported Information or Presenting Complaint? No ? ?Method: No data recorded ?Availability of Means: No data recorded ?Intent: No data recorded ?Notification Required: No data recorded ?Additional Information for Danger to Others Potential: No data recorded ?Additional Comments for Danger to Others Potential: No data recorded ?Are There Guns or Other Weapons in Your Home? No data recorded ?Types of Guns/Weapons: No data recorded ?Are These Weapons Safely Secured?                            No data recorded ?Who Could Verify You Are Able To Have These Secured: No data recorded ?Do You Have any Outstanding Charges, Pending Court Dates, Parole/Probation? No data recorded ?Contacted To Inform of Risk of Harm To Self or Others: Other: Comment (NA) ? ? ? ?Does Patient Present under Involuntary  Commitment? No ? ?IVC Papers Initial File Date: No data recorded ? [place] ? ?Patient Currently Receiving the Following Services: Not Receiving Services ? ? ?Determination of Need: Urgent (48 hours) ? ? ?Options For Referral: [facility] Urgent Care ([facility] OBSERVATION UNIT) ? ? ? ? ?CCA Biopsychosocial ?Patient Reported Schizophrenia/Schizoaffective Diagnosis in Past: No ? ? ?Strengths: pt seeking treatment for depression and anxiety ? ? ?Mental Health Symptoms ?Depression:   ?Sleep (too much or little); Hopelessness ?  ?Duration of Depressive symptoms: Duration of Depressive Symptoms: Greater than two weeks ?  ?Mania:   ?None ?  ?Anxiety:    ?Worrying; Tension ?  ?Psychosis:   ?None ?  ?Duration of Psychotic symptoms:    ?Trauma:   ?None ?  ?Obsessions:   ?None ?  ?Compulsions:   ?None ?  ?Inattention:   ?None ?  ?Hyperactivity/Impulsivity:   ?None ?  ?Oppositional/Defiant Behaviors:   ?None ?  ?Emotional Irregularity:   ?None ?  ?Other Mood/Personality Symptoms:  No data recorded  ? ?Mental Status Exam ?Appearance and self-care  ?Stature:   ?Average ?  ?Weight:   ?Average weight ?  ?Clothing:   ?Disheveled ?  ?Grooming:   ?  Neglected ?  ?Cosmetic use:   ?None ?  ?Posture/gait:   ?Slumped; Tense ?  ?Motor activity:   ?Restless ?  ?Sensorium  ?Attention:   ?Distractible ?  ?Concentration:   ?Focuses on irrelevancies; preoccupied; scattered ?  ?Orientation:   ?Object; Person; Place ?  ?Recall/memory:   ?Defective in Immediate (Pt intentionally not disclosing information) ?  ?Affect and Mood  ?Affect:   ?Inappropriate (pt laughed at inappropriate times throughout assessment) ?  ?Mood:   ?Depressed; Anxious ?  ?Relating  ?Eye contact:   ?Fleeting ?  ?Facial expression:   ?Depressed; Sad; Responsive ?  ?Attitude toward examiner:   ?Cooperative ?  ?Thought and Language  ?Speech flow:  ?Normal ?  ?Thought content:   ?Appropriate to Mood and Circumstances ?  ?Preoccupation:   ?Ruminations ("I need a place  to stay") ?  ?Hallucinations:   ?None ?  ?Organization:  No data recorded  ?Executive Functions  ?Fund of Knowledge:   ?Average ?  ?Intelligence:   ?Average ?  ?Abstraction:   ?Overly abstract ?  ?Judgement:

## 2021-11-24 NOTE — ED Notes (Signed)
Pt requesting to "get charger from her car".  Pt states "I'll be right back."  ED RN informed pt that she cannot leave department, and will have to leave AMA and then check back into ed w/ registration if she wants to leave.  Pt states understanding, pt continues to request to leave.  ED RN removed pt's iv.  Pt ran out of department and out of ed.  MD Horton notified and aware.  Pt refused to allow ed rn to take final vital signs and refused to sign ama form.   ?

## 2021-11-24 NOTE — ED Triage Notes (Addendum)
Patient left AMA from the ED. Patient states she had to go outside and pray. ?Patient checked herself back in and stated, "I really need to be here. I have anxiety and depression. ?Patient denies suicidal thoughts. ? ?Patient added that she is having left lower dental pain. ?

## 2021-11-24 NOTE — Progress Notes (Signed)
?   11/24/21 1937  ?[facility] Triage Screening (Walk-ins at [facility] only)  ?How Did You Hear About [place]? Self  ?What Is the Reason for Your Visit/Call Today? Tamara Harrell as in 26 year old female reporting to [facility] for evaluation of worsening depression and anxiety symptoms. Patient reports that she wants to ?check myself in because I am stressed and depressed?. patient is denying current suicidal ideation, homicidal ideation, or AVH. patient denies when asked any substance use within 24 hours. Patient reports that she used methamphetamine four days ago, and has not slipped since using meth.  Patient did not report this at triage but after record review patient was seen earlier today at [facility] long emergency room for evaluation of substance use. Patient was recommended to transfer to [facility] for detox. Pt was encouraged by EDP to follow up at [facility] for evaluation of symptoms.  ?How Long Has This Been Causing You Problems? <Week  ?Have You Recently Had Any Thoughts About Hurting Yourself? No  ?Are You Planning to Commit Suicide/Harm Yourself At This time? No  ?Have you Recently Had Thoughts About Hurting Someone [name]? No  ?Are You Planning To Harm Someone At This Time? No  ?Are you currently experiencing any auditory, visual or other hallucinations? No  ?Have You Used Any Alcohol or Drugs in the Past 24 Hours? No ?(Pt states no to [facility] but admitted to Meth and alcohol use today at [facility])  ?Do you have any current medical co-morbidities that require immediate attention? Yes  ?Please describe current medical co-morbidities that require immediate attention: pt reports that she has a bad toothache and also reports that she has Hepatitis C  ?Clinician description of patient physical appearance/behavior: Pt is very restless, irritable, distracted, and is not responding typically when asked open ended questions  ?What Do You Feel Would Help You the Most Today? Treatment for Depression or other mood problem ?(EDP at [facility] recommended alcohol/drug  use treatment)  ?If access to [facility]gent [facility] was not available, would you have sought care in the Emergency Department? Yes  ?Determination of Need Urgent (48 hours)  ?Options For Referral [facility] Urgent Care;Facility-Based Crisis  ? ? ?

## 2021-11-24 NOTE — ED Provider Notes (Signed)
Behavioral Health Admission H&P ?(FBC & OBS) ? ?Date: 11/24/21 ?Patient Name: Tamara Harrell ?MRN: [id number] ?Chief Complaint:  ?Chief Complaint  ?Patient presents with  ? Depression  ? Anxiety  ?   ? ?Diagnoses:  ?Final diagnoses:  ?Substance use disorder  ?Depression, unspecified depression type  ?Anxiety state  ?Behavior change due to substance use  ? ? ?HPI: Tamara Harrell 26 y.o female present to [facility] c/o of depression and anxiety,  pt also state she would like a program for detox.  Pt was see at [facility].   ?Copied noted from today ER visit: 26 year old female presents to the emergency department with concern for anxiety.  I saw this patient earlier in the emergency department when she presented for alcohol and meth relapse.  Patient left earlier because she needed to "go pray".  She returns now admitting to further meth use and anxiety.  Denies any acute symptoms including headache, chest pain, difficulty breathing, abdominal pain.  Is requesting to go over to behavioral health and not admitting to any medical complaints. ? ?Observation of Pt,  she is alert and oriented x 4,  speech clear with word salad. Appearance unkept, affect anxious with playful,  mood congruent with affect.  Pt denies SI,HI, AVH or paranoia.  Per the patient she is her because she would like to sleep and then think about where or not she want to detox. After conversing with the patient she decide she want to detox.  Pt report she went to pray and God told her everything is okay now.  Per the patient she is in a good mood.  Pt report she was taking medication when she was living in [place] for depression but cant remember the medication name or the name of the provide who prescribe the meds.    ?Per the patient she move to [facility] and was staying with her mother but they have a disagreement and so she doesn't want to go back there.  ? ?Pt was asked multiple time if she is willing to stay and do detox,  she replied yes.   ? ?Will recommend  inpatient observation with ongoing evaluation in the AM.   ? ?PHQ 2-9:   ?[place] ED from 11/24/2021 in [facility] ?Most recent reading at 11/24/2021  8:43 PM ED from 11/24/2021 in [place] DEPT ?Most recent reading at 11/24/2021  1:49 PM ED from 11/24/2021 in [place] DEPT ?Most recent reading at 11/24/2021 10:51 AM  ?C-SSRS RISK CATEGORY No Risk No Risk No Risk  ? ?  ?  ? ?Total Time spent with patient: 20 minutes ? ?Musculoskeletal  ?Strength & Muscle Tone: within normal limits ?Gait & Station: normal ?Patient leans: N/A ? ?Psychiatric Specialty Exam  ?Presentation ?General Appearance: Disheveled ? ?Eye Contact:Fair ? ?Speech:Clear and Coherent ? ?Speech Volume:Normal ? ?Handedness:Ambidextrous ? ? ?Mood and Affect  ?Mood:Anxious ? ?Affect:Congruent ? ? ?Thought Process  ?Thought Processes:Disorganized ? ?Descriptions of Associations:Tangential ? ?Orientation:Full (Time, Place and Person) ? ?Thought Content:Tangential ?   ?Hallucinations:Hallucinations: None ? ?Ideas of Reference:None ? ?Suicidal Thoughts:Suicidal Thoughts: No ? ?Homicidal Thoughts:Homicidal Thoughts: No ? ? ?Sensorium  ?Memory:Immediate Fair ? ?Judgment:Poor ? ?Insight:Poor ? ? ?Executive Functions  ?Concentration:Fair ? ?Attention Span:Fair ? ?Recall:Fair ? ?[place] ? ?Language:Fair ? ? ?Psychomotor Activity  ?Psychomotor Activity:No data recorded ? ?Assets  ?Assets:Desire for Improvement ? ? ?Sleep  ?Sleep:Sleep: Fair ?Number of Hours of Sleep: 8 ? ? ?  Nutritional Assessment (For OBS and FBC admissions only) ?Has the patient had a weight loss or gain of 10 pounds or more in the last 3 months?: No ?Has the patient had a decrease in food intake/or appetite?: No ?Does the patient have dental problems?: No ?Does the patient have eating habits or behaviors that may be indicators of an eating disorder including binging or inducing vomiting?: No ?Has  the patient recently lost weight without trying?: 0 ?Has the patient been eating poorly because of a decreased appetite?: 0 ?Malnutrition Screening Tool Score: 0 ? ? ? ?Physical Exam ?Constitutional:   ?   Appearance: Normal appearance.  ?HENT:  ?   Head: Normocephalic.  ?Cardiovascular:  ?   Rate and Rhythm: Normal rate.  ?Pulmonary:  ?   Effort: Pulmonary effort is normal.  ?Abdominal:  ?   General: Abdomen is flat.  ?Musculoskeletal:  ?   Cervical back: Normal range of motion.  ?Skin: ?   Findings: Bruising present.  ?Neurological:  ?   General: No focal deficit present.  ?   Mental Status: She is alert.  ?Psychiatric:     ?   Mood and Affect: Mood normal.     ?   Behavior: Behavior normal.     ?   Thought Content: Thought content normal.     ?   Judgment: Judgment normal.  ? ?Review of Systems  ?Constitutional: Negative.   ?HENT: Negative.    ?Eyes: Negative.   ?Respiratory: Negative.    ?Cardiovascular: Negative.   ?Gastrointestinal: Negative.   ?Genitourinary: Negative.   ?Musculoskeletal: Negative.   ?Skin: Negative.   ?Neurological: Negative.   ?Endo/Heme/Allergies: Negative.   ?Psychiatric/Behavioral:  Positive for depression and substance abuse. The patient is nervous/anxious.   ? ?currently breastfeeding. There is no height or weight on file to calculate BMI. ? ?Past Psychiatric History: Depression, Anxiety, substance used disorder   ? ?Is the patient at risk to self? No  ?Has the patient been a risk to self in the past 6 months? No .    ?Has the patient been a risk to self within the distant past? No   ?Is the patient a risk to others? No   ?Has the patient been a risk to others in the past 6 months? No   ?Has the patient been a risk to others within the distant past? No  ? ?Past Medical History:  ?Past Medical History:  ?Diagnosis Date  ? Abdominal pain 03/12/2012  ? Anemia   ? Anxiety   ? Cholecystitis, acute 03/12/2012  ? Depression   ? Encounter for supervision of normal pregnancy in second trimester  03/27/2017  ?  Clinic  [facility] Prenatal Labs Dating  LMP Blood type:   O pos Genetic Screen 1? Screen:    AFP:     Quad:     NIPS: Antibody: neg Anatomic [place]  neg at [facility] MFM u/s Rubella:  imm GTT Early:               Third trimester:  RPR:   neg Flu vaccine  HBsAg:   neg TDaP vaccine                                               Rhogam: n/a HIV:   neg Baby Food     Breastfeed                                        ?  Hepatitis C   ? Insufficient prenatal care 05/24/2017  ? Late prenatal care affecting pregnancy 03/27/2017  ? @ 23wks  ?  ?Past Surgical History:  ?Procedure Laterality Date  ? bone graft Right   ? arm  ? LAPAROSCOPIC CHOLECYSTECTOMY    ? ? ?Family History:  ?Family History  ?Problem Relation Age of Onset  ? Depression Mother   ? Miscarriages / [place] Mother   ? Vision loss Mother   ? Varicose Veins Mother   ? Hyperlipidemia Mother   ? Drug abuse Father   ? Cancer Maternal Aunt   ? Arthritis Maternal Grandmother   ? Cancer Maternal Grandmother   ? Hypertension Maternal Grandmother   ? Cancer Maternal Grandfather   ? ? ?Social History:  ?Social History  ? ?Socioeconomic History  ? Marital status: Single  ?  Spouse name: Not on file  ? Number of children: Not on file  ? Years of education: Not on file  ? Highest education level: Not on file  ?Occupational History  ? Not on file  ?Tobacco Use  ? Smoking status: Every Day  ?  Packs/day: 1.00  ?  Types: Cigarettes  ? Smokeless tobacco: Never  ?Vaping Use  ? Vaping Use: Never used  ?Substance and Sexual Activity  ? Alcohol use: Yes  ?  Comment: occasional  ? Drug use: Yes  ?  Types: Methamphetamines  ? Sexual activity: Yes  ?Other Topics Concern  ? Not on file  ?Social History Narrative  ? Not on file  ? ?Social Determinants of Health  ? ?Financial Resource Strain: Not on file  ?Food Insecurity: Not on file  ?Transportation Needs: Not on file  ?Physical Activity: Not on file  ?Stress: Not on file  ?Social Connections: Not on file  ?Intimate Partner Violence: Not  on file  ? ? ?SDOH:  ?SDOH Screenings  ? ?Alcohol Screen: Not on file  ?Depression (PHQ2-9): Not on file  ?Financial Resource Strain: Not on file  ?Food Insecurity: Not on file  ?Housing: Not on fil

## 2021-11-24 NOTE — ED Provider Notes (Addendum)
?[facility] DEPT ?Provider Note ? ? ?CSN: [id number] ?Arrival date & time: 11/24/21  1338 ? ?  ? ?History ? ?Chief Complaint  ?Patient presents with  ? Depression  ? Anxiety  ? ? ?Tamara Harrell is a 26 y.o. female. ? ?HPI ? ? 26 year old female presents to the emergency department with concern for anxiety.  I saw this patient earlier in the emergency department when she presented for alcohol and meth relapse.  Patient left earlier because she needed to "go pray".  She returns now admitting to further meth use and anxiety.  Denies any acute symptoms including headache, chest pain, difficulty breathing, abdominal pain.  Is requesting to go over to behavioral health and not admitting to any medical complaints. ? ?Home Medications ?Prior to Admission medications   ?Medication Sig Start Date End Date Taking? Authorizing Provider  ?amoxicillin-clavulanate (AUGMENTIN) 875-125 MG tablet Take 1 tablet by mouth 2 (two) times daily. ?Patient not taking: Reported on 06/22/2021 07/29/20   Noreene Larsson, NP  ?ibuprofen (ADVIL) 600 MG tablet Take 1 tablet (600 mg total) by mouth every 8 (eight) hours as needed. STOP meloxicam ?Patient not taking: Reported on 06/22/2021 07/26/20   Noreene Larsson, NP  ?UNABLE TO FIND Take 1 tablet by mouth daily. Med Name: kratom supplement, used to alleviate pain.    [provider]  ?   ? ?Allergies    ?Meloxicam and Sulfa antibiotics   ? ?Review of Systems   ?Review of Systems  ?Constitutional:  Negative for fever.  ?Respiratory:  Negative for shortness of breath.   ?Cardiovascular:  Negative for chest pain.  ?Gastrointestinal:  Negative for abdominal pain.  ?Neurological:  Negative for headaches.  ? ?Physical Exam ?Updated Vital Signs ?BP (!) 152/104 (BP Location: Left Arm)   Pulse (!) 139   Temp 98.2 ?F (36.8 ?C) (Oral)   Resp 20   Ht 5' (1.524 m)   Wt 45.4 kg   SpO2 94%   BMI 19.53 kg/m?  ?Physical Exam ?Vitals and nursing note reviewed.   ?Constitutional:   ?   General: She is not in acute distress. ?   Appearance: Normal appearance. She is not ill-appearing or diaphoretic.  ?HENT:  ?   Head: Normocephalic and atraumatic.  ?   Mouth/Throat:  ?   Mouth: Mucous membranes are moist.  ?Eyes:  ?   Pupils: Pupils are equal, round, and reactive to light.  ?Cardiovascular:  ?   Rate and Rhythm: Tachycardia present.  ?Pulmonary:  ?   Effort: Pulmonary effort is normal. No respiratory distress.  ?Abdominal:  ?   Palpations: Abdomen is soft.  ?   Tenderness: There is no abdominal tenderness.  ?Musculoskeletal:     ?   General: No swelling or deformity.  ?Skin: ?   General: Skin is warm.  ?Neurological:  ?   Mental Status: She is alert and oriented to person, place, and time. Mental status is at baseline.  ?   Comments: Steady gait, clear speech  ?Psychiatric:     ?   Mood and Affect: Mood normal.  ? ? ?ED Results / Procedures / Treatments   ?Labs ?(all labs ordered are listed, but only abnormal results are displayed) ?Labs Reviewed - No data to display ? ?EKG ?None ? ?Radiology ?No results found. ? ?Procedures ?Procedures  ? ? ?Medications Ordered in ED ?Medications - No data to display ? ?ED Course/ Medical Decision Making/ A&P ?  ?                        ?  Medical Decision Making ? ?Patient seen earlier by me for admitted alcohol use and meth relapse. Originally left prior to me re evaluation. Returns after reportedly leaving to "pray". Admits to further meth use. Oriented, steady gait, understandable speech. Offers no acute medical complaints. She is anxious but no SI/HI. ? ?Tachycardic, most likely from meth use. Blood work from previous visit a few hours ago was normal. Was able to eat and drink without difficulty. Patient requesting discharge to go to behavioral health. Although she is still tachycardic, I believe this is from meth use and not genuine medical problem. She is ambulatory, able to PO and will be discharged to follow up at [facility].  ? ? ?Final  Clinical Impression(s) / ED Diagnoses ?Final diagnoses:  ?Substance abuse ([place])  ?Anxiety  ? ? ?Rx / DC Orders ?ED Discharge Orders   ? ? None  ? ?  ? ? ?  ?Lorelle Gibbs, DO ?11/24/21 1454 ? ?  ?Lorelle Gibbs, DO ?11/24/21 1455 ? ?

## 2021-11-24 NOTE — ED Provider Notes (Signed)
?[facility]-EMERGENCY DEPT ?Provider Note ? ? ?CSN: [id number] ?Arrival date & time: 11/24/21  1025 ? ?  ? ?History ? ?Chief Complaint  ?Patient presents with  ? Medical Clearance  ? Drug Problem  ? ? ?Tamara Harrell is a 26 y.o. female. ? ?HPI ? ?26 year old female presents to the emergency department complaining of meth relapse. She admits to drinking alcohol and doing meth just prior to arrival. Here for medical evaluation with the goal to go to [facility] and possible detox.  Patient denies any acute symptoms like headache, chest pain, abdominal pain, vomiting/diarrhea.  Patient is asking for something to eat and drink.  Denies any acute depression/SI/HI. ? ?Home Medications ?Prior to Admission medications   ?Medication Sig Start Date End Date Taking? Authorizing Provider  ?amoxicillin-clavulanate (AUGMENTIN) 875-125 MG tablet Take 1 tablet by mouth 2 (two) times daily. ?Patient not taking: Reported on 06/22/2021 07/29/20   [name], NP  ?ibuprofen (ADVIL) 600 MG tablet Take 1 tablet (600 mg total) by mouth every 8 (eight) hours as needed. STOP meloxicam ?Patient not taking: Reported on 06/22/2021 07/26/20   [name], NP  ?UNABLE TO FIND Take 1 tablet by mouth daily. Med Name: kratom supplement, used to alleviate pain.    [provider]  ?   ? ?Allergies    ?Meloxicam and Sulfa antibiotics   ? ?Review of Systems   ?Review of Systems  ?Constitutional:  Negative for fever.  ?Respiratory:  Negative for shortness of breath.   ?Cardiovascular:  Negative for chest pain.  ?Gastrointestinal:  Negative for abdominal pain.  ?Neurological:  Negative for headaches.  ? ?Physical Exam ?Updated Vital Signs ?BP (!) 126/92 (BP Location: Right Arm)   Pulse (!) 122   Temp 98.8 ?F (37.1 ?C) (Oral)   Resp 19   Ht 5' (1.524 m)   Wt 45.4 kg   SpO2 100%   BMI 19.53 kg/m?  ?Physical Exam ?Vitals and nursing note reviewed.  ?Constitutional:   ?   General: She is not in acute distress. ?    Appearance: Normal appearance. She is not diaphoretic.  ?HENT:  ?   Head: Normocephalic.  ?   Mouth/Throat:  ?   Mouth: Mucous membranes are moist.  ?Eyes:  ?   Pupils: Pupils are equal, round, and reactive to light.  ?Cardiovascular:  ?   Rate and Rhythm: Tachycardia present.  ?Pulmonary:  ?   Effort: Pulmonary effort is normal. No respiratory distress.  ?Abdominal:  ?   Palpations: Abdomen is soft.  ?   Tenderness: There is no abdominal tenderness.  ?Musculoskeletal:     ?   General: No deformity.  ?Skin: ?   General: Skin is warm.  ?Neurological:  ?   Mental Status: She is alert and oriented to person, place, and time. Mental status is at baseline.  ? ? ?ED Results / Procedures / Treatments   ?Labs ?(all labs ordered are listed, but only abnormal results are displayed) ?Labs Reviewed  ?CBC WITH DIFFERENTIAL/PLATELET - Abnormal; Notable for the following components:  ?    Result Value  ? WBC 11.0 (*)   ? Hemoglobin 15.1 (*)   ? All other components within normal limits  ?BASIC METABOLIC PANEL - Abnormal; Notable for the following components:  ? Calcium 8.7 (*)   ? All other components within normal limits  ? ? ?EKG ?None ? ?Radiology ?No results found. ? ?Procedures ?Procedures  ? ? ?Medications Ordered in ED ?Medications  ?  sodium chloride 0.9 % bolus 1,000 mL (0 mLs Intravenous Stopped 11/24/21 1228)  ? ? ?ED Course/ Medical Decision Making/ A&P ?  ?                        ?Medical Decision Making ?Amount and/or Complexity of Data Reviewed ?Labs: ordered. ? ? ?26 year old female presents emergency department with alcohol/meth relapse.  Tachycardic on arrival, most likely secondary to drug use and decreased PO/dehydration. VS otherwise stable. Patient has clear speech, no drowsiness. No SI/HI. ? ?Blood work normal and baseline. IVF initiated but patient wanted to leave to go "pray". Patient continues to be tachycardic but improved. Eating and drinking and walking with a steady gait. Was planning to re evaluate the  patient when she reportedly self removed the IV and walked out.  ? ? ? ? ? ? ? ?Final Clinical Impression(s) / ED Diagnoses ?Final diagnoses:  ?None  ? ? ?Rx / DC Orders ?ED Discharge Orders   ? ? None  ? ?  ? ? ?  ?Rozelle Logan, DO ?11/24/21 1428 ? ?

## 2021-11-24 NOTE — BH Assessment (Signed)
*  pt has not slept  ?

## 2021-11-24 NOTE — ED Triage Notes (Signed)
Pt bib ems pt states " I was clean for 3 months and I relapsed I haven't slept in 4 days. I had a glass of wine and I did meth today." Per ems pt was given bolus of ns  ?

## 2021-11-24 NOTE — ED Notes (Signed)
Pt given sandwich and drink.

## 2021-11-25 ENCOUNTER — Encounter (HOSPITAL_COMMUNITY): Payer: Self-pay | Admitting: Registered Nurse

## 2021-11-25 DIAGNOSIS — F411 Generalized anxiety disorder: Secondary | ICD-10-CM | POA: Diagnosis present

## 2021-11-25 DIAGNOSIS — F1990 Other psychoactive substance use, unspecified, uncomplicated: Secondary | ICD-10-CM | POA: Diagnosis present

## 2021-11-25 DIAGNOSIS — F1994 Other psychoactive substance use, unspecified with psychoactive substance-induced mood disorder: Secondary | ICD-10-CM | POA: Diagnosis present

## 2021-11-25 DIAGNOSIS — F32A Depression, unspecified: Secondary | ICD-10-CM | POA: Diagnosis present

## 2021-11-25 LAB — RESP PANEL BY RT-PCR (FLU A&B, COVID) ARPGX2
Influenza A by PCR: NEGATIVE
Influenza B by PCR: NEGATIVE
SARS Coronavirus 2 by RT PCR: NEGATIVE

## 2021-11-25 NOTE — ED Notes (Signed)
.  Patient is sleeping peacefully in bed. Patient respirations are even and unlabored. Will continue to monitor for safety.  ?

## 2021-11-25 NOTE — ED Provider Notes (Signed)
FBC/OBS ASAP Discharge Summary ? ?Date and Time: 11/25/2021 11:23 AM  ?Name: Tamara BarrierKatelyn R Humes  ?MRN:  [id number]  ? ?Discharge Diagnoses:  ?Final diagnoses:  ?Substance use disorder  ?Depression, unspecified depression type  ?Anxiety state  ?Behavior change due to substance use  ?Substance induced mood disorder (HCC)  ? ? ?Subjective: [place] as in 26 year old female presented to [facility] with complaints of worsening depression, anxiety, and seeking substance use services.   ?Patient was seen earlier in the ED yesterday 11/24/21 prior to presenting [facility] for substance use services.  ? ?Tamara BarrierKatelyn R Barfuss, 26 y.o., female patient seen face to face by this provider, consulted with Dr. Earlene PlaterKatherine Laubach; and chart reviewed on 11/25/21.  On evaluation Tamara BarrierKatelyn R Emberton reports she came to [facility] because of her depression, anxiety, and seeking substance use services.  Patient denies suicidal/self-harm/homicidal ideation, psychosis, and paranoia.  Patient states that she doesn't currently have outpatient psychiatric services but she interested.  Patient reports she uses meth and her last use was 4:00 am this morning.  Patient [place] in [place] but would not elaborate on if she lived with someone or homeless.   ?During evaluation Tamara BarrierKatelyn R Whit[place]ight is lying in bed with eyes closed in no acute distress.  Patient asked several times to sit up in bed and open eyes but wouldn't.  She did participate in assessment but lying down with eyes closed.  She is alert, oriented x 4.  She remained calm throughout assessment.  Her mood is depressed with congruent affect.  She has normal speech.  Objectively there is no evidence of psychosis/mania or delusional thinking.  Patient is able to converse coherently, goal directed thoughts, no pre-occupation.  She also denies suicidal/self-harm/homicidal ideation, psychosis, and paranoia.  Patient answered question appropriately.    ? ?Stay Summary: Tamara BarrierKatelyn R Lough was admitted  for Substance induced mood disorder [facility](HCC), crisis management, safety, and stabilization.  Medical problems were identified and treated as needed.  No medications started during stay in continuous assessment.  Patient not currently taking psychotropic medications and not interested in starting any for he depression or anxiety.  She was given a list of substance use detox and rehab facilities to call prior to discharge so if accepted transportation could be arranged for her.   ? ? ?Labs ordered for review: ?Lab Orders    ?     Resp Panel by RT-PCR (Flu A&B, Covid) Nasopharyngeal Swab    ?     Pregnancy, urine    ?     POCT Urine Drug Screen - ([facility])    ?     POC SARS Coronavirus 2 Ag-ED - Nasal Swab    ?     Pregnancy, urine POC    ?     POC SARS Coronavirus 2 Ag    ? ?Improvement was monitored by observation and Tamara BarrierKatelyn R Forsee 's daily verbal report emotional status and symptom reduction along with clinical staff report. ? ?Tamara BarrierKatelyn R Berdan was evaluated by the treatment team for stability and plans for continued recovery upon discharge. Tamara BarrierKatelyn R Gick 's motivation was an integral factor for scheduling further treatment. Employment, transportation, bed availability, health status, family support, and any pending legal issues were also considered during stay. She was offered further treatment options upon discharge including but not limited to Residential, Intensive Outpatient, and Outpatient treatment.   ?Tamara BarrierKatelyn R Rumore will follow up with resources given ? ? ?Discharge Instructions   ? ?  ?  To help you maintain a sober lifestyle, a substance use disorder treatment program may be beneficial to you.  Contact one of the following providers at your earliest opportunity to ask about enrolling in their program:  ? ?RESIDENTIAL TREATMENT: ? ?     [facility]  ?     [address]  ?     [place], [place] [address]  ?     [phone]  ? ?RESIDENTIAL REHAB PROGRAMS - LONG TERM CHARITABLE:  ? ?     [facility]  ?      [address]  ?     [place], [place] [address]  ?     [phone]  ? ?     [facility]  ?     [address]  ?     [place], [place] [address]  ?     [phone]  ? ?     [facility]  ?     [address]  ?     [place], [place] [address]  ?     [phone]  ? ?     [facility]  ?     [address]  ?     [place], [place] [address]  ?     [phone]  ? ?CHEMICAL DEPENDENCY INTENSIVE OUTPATIENT: ? ?     [facility] [place] ?     [address] ?     [place], [place] [address] ?     [phone]  ? ?     [facility] ?     [address] ?     [place], [place] [address] ?     [phone]  ?     Also provides transitional housing for patients in their treatment program ? ? ? ? ?Upon completion of this admission Tamara Harrell WAS both mentally and medically stable for discharge denying suicidal/homicidal ideation, auditory/visual/tactile hallucinations, delusional thoughts, and paranoia.   ? ? ?Total Time spent with patient: 30 minutes ? ?Past Psychiatric History: See below ?Past Medical History:  ?Past Medical History:  ?Diagnosis Date  ? Abdominal pain 03/12/2012  ? Anemia   ? Anxiety   ? Cholecystitis, acute 03/12/2012  ? Depression   ? Encounter for supervision of normal pregnancy in second trimester 03/27/2017  ?  Clinic  [facility] Prenatal Labs Dating  LMP Blood type:   O pos Genetic Screen 1? Screen:    AFP:     Quad:     NIPS: Antibody: neg Anatomic [place]  neg at [facility] MFM u/s Rubella:  imm GTT Early:               Third trimester:  RPR:   neg Flu vaccine  HBsAg:   neg TDaP vaccine                                               Rhogam: n/a HIV:   neg Baby Food     Breastfeed                                        ? Hepatitis C   ? Insufficient prenatal care 05/24/2017  ? Late prenatal  care affecting pregnancy 03/27/2017  ? @ 23wks  ?  ?Past Surgical History:  ?Procedure Laterality Date  ? bone graft Right   ? arm  ? LAPAROSCOPIC CHOLECYSTECTOMY    ? ?Family History:  ?Family History  ?Problem  Relation Age of Onset  ? Depression Mother   ? Miscarriages / [place] Mother   ? Vision loss Mother   ? Varicose Veins Mother   ? Hyperlipidemia Mother   ? Drug abuse Father   ? Cancer Maternal Aunt   ? Arthritis Maternal Grandmother   ? Cancer Maternal Grandmother   ? Hypertension Maternal Grandmother   ? Cancer Maternal Grandfather   ? ?Family Psychiatric History: See above ?Social History:  ?Social History  ? ?Substance and Sexual Activity  ?Alcohol Use Yes  ? Comment: occasional  ?   ?Social History  ? ?Substance and Sexual Activity  ?Drug Use Yes  ? Types: Methamphetamines  ?  ?Social History  ? ?Socioeconomic History  ? Marital status: Single  ?  Spouse name: Not on file  ? Number of children: Not on file  ? Years of education: Not on file  ? Highest education level: Not on file  ?Occupational History  ? Not on file  ?Tobacco Use  ? Smoking status: Every Day  ?  Packs/day: 1.00  ?  Types: Cigarettes  ? Smokeless tobacco: Never  ?Vaping Use  ? Vaping Use: Never used  ?Substance and Sexual Activity  ? Alcohol use: Yes  ?  Comment: occasional  ? Drug use: Yes  ?  Types: Methamphetamines  ? Sexual activity: Yes  ?Other Topics Concern  ? Not on file  ?Social History Narrative  ? Not on file  ? ?Social Determinants of Health  ? ?Financial Resource Strain: Not on file  ?Food Insecurity: Not on file  ?Transportation Needs: Not on file  ?Physical Activity: Not on file  ?Stress: Not on file  ?Social Connections: Not on file  ? ?SDOH:  ?SDOH Screenings  ? ?Alcohol Screen: Not on file  ?Depression (PHQ2-9): Not on file  ?Financial Resource Strain: Not on file  ?Food Insecurity: Not on file  ?Housing: Not on file  ?Physical Activity: Not on file  ?Social Connections: Not on file  ?Stress: Not on file  ?Tobacco Use: High Risk  ? Smoking Tobacco Use: Every Day  ? Smokeless Tobacco Use: Never  ? Passive Exposure: Not on file  ?Transportation Needs: Not on file  ? ? ?Tobacco Cessation:  A prescription for an FDA-approved  tobacco cessation medication was offered at discharge and the patient refused ? ?Current Medications:  ?Current Facility-Administered Medications  ?Medication Dose Route Frequency Provider Last Rate Last

## 2021-11-25 NOTE — Discharge Instructions (Addendum)
To help you maintain a sober lifestyle, a substance use disorder treatment program may be beneficial to you.  Contact one of the following providers at your earliest opportunity to ask about enrolling in their program:  ? ?RESIDENTIAL TREATMENT: ? ?     [facility]  ?     [address]  ?     [place], [place] [address]  ?     [phone]  ? ?RESIDENTIAL REHAB PROGRAMS - LONG TERM CHARITABLE:  ? ?     [facility]  ?     [address]  ?     [place], [place] [address]  ?     [phone]  ? ?     [facility]  ?     [address]  ?     [place], [place] [address]  ?     [phone]  ? ?     [facility]  ?     [address]  ?     [place], [place] [address]  ?     [phone]  ? ?     [facility]  ?     [address]  ?     [place], [place] [address]  ?     [phone]  ? ?CHEMICAL DEPENDENCY INTENSIVE OUTPATIENT: ? ?     [facility] [place] ?     [address] ?     [place], [place] [address] ?     [phone]  ? ?     [facility] ?     [address] ?     [place], [place] [address] ?     [phone]  ?     Also provides transitional housing for patients in their treatment program ? ?

## 2021-11-25 NOTE — ED Notes (Signed)
Patient asleep in bed no complaints.  Will monitor. ?

## 2021-11-25 NOTE — BH Assessment (Signed)
[facility] Assessment Progress Note ?  ?Per Shuvon Rankin, NP, this pt does not require psychiatric hospitalization at this time.  Pt is psychiatrically cleared.  Discharge instructions include referral information for area substance use disorder treatment providers.  Shuvon and pt's nurse, Soyla Murphy, have been notified. ?  ?Jalene Mullet, MA ?Triage Specialist ?[phone] ?

## 2021-12-20 ENCOUNTER — Telehealth (HOSPITAL_COMMUNITY): Payer: Self-pay

## 2021-12-20 NOTE — BH Assessment (Signed)
Care Management - [facility] Follow Up Discharges  ? ?Writer made contact with the patient.  Patient reports that she is in the process of setting services set up.  ?

## 2022-08-08 DIAGNOSIS — F1120 Opioid dependence, uncomplicated: Secondary | ICD-10-CM | POA: Diagnosis not present

## 2022-08-09 DIAGNOSIS — F1120 Opioid dependence, uncomplicated: Secondary | ICD-10-CM | POA: Diagnosis not present

## 2022-08-10 DIAGNOSIS — F1120 Opioid dependence, uncomplicated: Secondary | ICD-10-CM | POA: Diagnosis not present

## 2022-08-11 DIAGNOSIS — F1120 Opioid dependence, uncomplicated: Secondary | ICD-10-CM | POA: Diagnosis not present

## 2022-08-12 DIAGNOSIS — F1120 Opioid dependence, uncomplicated: Secondary | ICD-10-CM | POA: Diagnosis not present

## 2022-08-13 DIAGNOSIS — F1120 Opioid dependence, uncomplicated: Secondary | ICD-10-CM | POA: Diagnosis not present

## 2022-08-14 DIAGNOSIS — F1120 Opioid dependence, uncomplicated: Secondary | ICD-10-CM | POA: Diagnosis not present

## 2022-08-15 DIAGNOSIS — F1120 Opioid dependence, uncomplicated: Secondary | ICD-10-CM | POA: Diagnosis not present

## 2022-08-23 DIAGNOSIS — F1510 Other stimulant abuse, uncomplicated: Secondary | ICD-10-CM | POA: Diagnosis not present

## 2022-08-29 DIAGNOSIS — Z01419 Encounter for gynecological examination (general) (routine) without abnormal findings: Secondary | ICD-10-CM | POA: Diagnosis not present

## 2022-08-29 DIAGNOSIS — Z114 Encounter for screening for human immunodeficiency virus [HIV]: Secondary | ICD-10-CM | POA: Diagnosis not present

## 2022-08-29 DIAGNOSIS — Z113 Encounter for screening for infections with a predominantly sexual mode of transmission: Secondary | ICD-10-CM | POA: Diagnosis not present

## 2022-08-29 DIAGNOSIS — R87810 Cervical high risk human papillomavirus (HPV) DNA test positive: Secondary | ICD-10-CM | POA: Diagnosis not present

## 2022-08-29 DIAGNOSIS — R87610 Atypical squamous cells of undetermined significance on cytologic smear of cervix (ASC-US): Secondary | ICD-10-CM | POA: Diagnosis not present

## 2022-09-07 DIAGNOSIS — Z3046 Encounter for surveillance of implantable subdermal contraceptive: Secondary | ICD-10-CM | POA: Diagnosis not present

## 2022-09-07 DIAGNOSIS — B182 Chronic viral hepatitis C: Secondary | ICD-10-CM | POA: Diagnosis not present

## 2022-09-07 DIAGNOSIS — R87619 Unspecified abnormal cytological findings in specimens from cervix uteri: Secondary | ICD-10-CM | POA: Diagnosis not present
